# Patient Record
Sex: Female | Born: 1970 | Race: White | Hispanic: No | Marital: Married | Smoking: Never smoker
Health system: Southern US, Community
[De-identification: ages and names within clinical notes are randomized; demographics above are authoritative.]

## PROBLEM LIST (undated history)

## (undated) DIAGNOSIS — T7840XA Allergy, unspecified, initial encounter: Secondary | ICD-10-CM

## (undated) DIAGNOSIS — M1990 Unspecified osteoarthritis, unspecified site: Secondary | ICD-10-CM

## (undated) DIAGNOSIS — F419 Anxiety disorder, unspecified: Secondary | ICD-10-CM

## (undated) DIAGNOSIS — A64 Unspecified sexually transmitted disease: Secondary | ICD-10-CM

## (undated) DIAGNOSIS — E7800 Pure hypercholesterolemia, unspecified: Secondary | ICD-10-CM

## (undated) DIAGNOSIS — M722 Plantar fascial fibromatosis: Secondary | ICD-10-CM

## (undated) DIAGNOSIS — H8109 Meniere's disease, unspecified ear: Secondary | ICD-10-CM

## (undated) HISTORY — DX: Plantar fascial fibromatosis: M72.2

## (undated) HISTORY — PX: WISDOM TOOTH EXTRACTION: SHX21

## (undated) HISTORY — DX: Anxiety disorder, unspecified: F41.9

## (undated) HISTORY — DX: Unspecified osteoarthritis, unspecified site: M19.90

## (undated) HISTORY — DX: Unspecified sexually transmitted disease: A64

## (undated) HISTORY — DX: Pure hypercholesterolemia, unspecified: E78.00

## (undated) HISTORY — DX: Allergy, unspecified, initial encounter: T78.40XA

## (undated) HISTORY — PX: TONSILLECTOMY AND ADENOIDECTOMY: SUR1326

---

## 2003-05-21 DIAGNOSIS — A64 Unspecified sexually transmitted disease: Secondary | ICD-10-CM

## 2003-05-21 HISTORY — DX: Unspecified sexually transmitted disease: A64

## 2007-03-04 ENCOUNTER — Other Ambulatory Visit: Admission: RE | Admit: 2007-03-04 | Discharge: 2007-03-04 | Payer: Self-pay | Admitting: Family Medicine

## 2007-12-10 ENCOUNTER — Emergency Department (HOSPITAL_COMMUNITY): Admission: EM | Admit: 2007-12-10 | Discharge: 2007-12-11 | Payer: Self-pay | Admitting: Emergency Medicine

## 2007-12-18 ENCOUNTER — Encounter: Admission: RE | Admit: 2007-12-18 | Discharge: 2007-12-18 | Payer: Self-pay | Admitting: Family Medicine

## 2010-05-31 ENCOUNTER — Other Ambulatory Visit
Admission: RE | Admit: 2010-05-31 | Discharge: 2010-05-31 | Payer: Self-pay | Source: Home / Self Care | Admitting: Family Medicine

## 2011-02-15 LAB — URINALYSIS, ROUTINE W REFLEX MICROSCOPIC
Glucose, UA: NEGATIVE
Ketones, ur: >80 — AB
Specific Gravity, Urine: 1.028
pH: 6.5

## 2011-02-15 LAB — CBC
HCT: 37.9
Hemoglobin: 12.9
RBC: 4.35
RDW: 12.5
WBC: 10.2

## 2011-02-15 LAB — POCT I-STAT, CHEM 8
BUN: 14
Calcium, Ion: 1.1 — ABNORMAL LOW
Chloride: 104
Creatinine, Ser: 1.1
Glucose, Bld: 86
Potassium: 3.9

## 2011-02-15 LAB — DIFFERENTIAL
Eosinophils Relative: 1
Lymphocytes Relative: 24
Lymphs Abs: 2.4
Monocytes Absolute: 0.7
Monocytes Relative: 7
Neutro Abs: 6.9

## 2011-02-15 LAB — B. BURGDORFI ANTIBODIES: B burgdorferi Ab IgG+IgM: 0.35

## 2011-09-18 ENCOUNTER — Ambulatory Visit: Payer: Self-pay | Admitting: Sports Medicine

## 2011-11-08 ENCOUNTER — Other Ambulatory Visit: Payer: Self-pay | Admitting: Family Medicine

## 2011-11-08 DIAGNOSIS — L989 Disorder of the skin and subcutaneous tissue, unspecified: Secondary | ICD-10-CM

## 2011-11-11 ENCOUNTER — Ambulatory Visit
Admission: RE | Admit: 2011-11-11 | Discharge: 2011-11-11 | Disposition: A | Discharge status: Home / Self Care | Payer: BC Managed Care – PPO | Source: Ambulatory Visit | Attending: Family Medicine | Admitting: Family Medicine

## 2011-11-11 ENCOUNTER — Other Ambulatory Visit: Payer: Self-pay | Admitting: Family Medicine

## 2011-11-11 DIAGNOSIS — L989 Disorder of the skin and subcutaneous tissue, unspecified: Secondary | ICD-10-CM

## 2012-02-06 ENCOUNTER — Encounter: Payer: Self-pay | Admitting: Obstetrics and Gynecology

## 2012-02-06 ENCOUNTER — Ambulatory Visit (INDEPENDENT_AMBULATORY_CARE_PROVIDER_SITE_OTHER): Payer: BC Managed Care – PPO | Admitting: Obstetrics and Gynecology

## 2012-02-06 VITALS — BP 90/60 | Resp 16 | Ht 66.0 in | Wt 128.0 lb

## 2012-02-06 DIAGNOSIS — N920 Excessive and frequent menstruation with regular cycle: Secondary | ICD-10-CM

## 2012-02-06 DIAGNOSIS — F419 Anxiety disorder, unspecified: Secondary | ICD-10-CM

## 2012-02-06 DIAGNOSIS — F411 Generalized anxiety disorder: Secondary | ICD-10-CM

## 2012-02-06 DIAGNOSIS — Z888 Allergy status to other drugs, medicaments and biological substances status: Secondary | ICD-10-CM

## 2012-02-06 DIAGNOSIS — N946 Dysmenorrhea, unspecified: Secondary | ICD-10-CM

## 2012-02-06 DIAGNOSIS — Z124 Encounter for screening for malignant neoplasm of cervix: Secondary | ICD-10-CM

## 2012-02-06 DIAGNOSIS — N979 Female infertility, unspecified: Secondary | ICD-10-CM

## 2012-02-06 DIAGNOSIS — Z889 Allergy status to unspecified drugs, medicaments and biological substances status: Secondary | ICD-10-CM

## 2012-02-06 DIAGNOSIS — H8109 Meniere's disease, unspecified ear: Secondary | ICD-10-CM

## 2012-02-06 LAB — POCT URINE PREGNANCY: Preg Test, Ur: NEGATIVE

## 2012-02-06 MED ORDER — TRANEXAMIC ACID 650 MG PO TABS: 1300.0000 mg | ORAL_TABLET | Freq: Three times a day (TID) | ORAL | Status: DC

## 2012-02-06 NOTE — Progress Notes (Signed)
Regular Periods: no Mammogram: yes 2011 wnl  Monthly Breast Ex.: yes Exercise: yes  Tetanus < 10 years: yes Seatbelts: yes  NI. Bladder Functn.: yes Abuse at home: no  Daily BM's: yes Stressful Work: NO  Healthy Diet: yes Sigmoid-Colonoscopy: NEVER  Calcium: yes Medical problems this year: c/o irregular menses, heavy menses, intolerable cramping   LAST PAP: Jan 2012 PCP informed her pap due until 2015  Contraception: none Trying conceive  Mammogram:  Yes 2011 wnl  PCP: Sigmund Hazel  PMH: see hx  FMH: see hx  Last Bone Scan: never   *PT STATES SHE IS CONCERNED BECAUSE SHE KNOWS IBUPROFEN IS BAD FOR YOUR STOMACH & SHE NEEDS IT EVERYDAY FOR PAIN.

## 2012-02-06 NOTE — Progress Notes (Signed)
Subjective:    Tamara Combs is a 41 y.o. female, No obstetric history on file., who presents for an annual exam as a new patient.  Her primary concerns are her desire to conceive and dysmenorrhea/menorrhagia.   Patient is employed as Museum/gallery curator at [employer].  Very physically active--running, biking, dancing.  Weight is stable.  No eating disorders noted.  She notes some struggles with anxiety.  Also has Meniere's disease.  Patient reports:  Stopped OCPs 1/12.  Previous Ortho-Tricyclen user since age 30 for dysmenorrhea and menorrhagia.  After stopping OCPs, cycles have been monthly, sometimes q 3 weeks, with resumption of dysmenorrhea.  Cycles heavy for 1st 1-2 days, then last additional 4 days with less flow.  She reports possible ovulatory signs, but has not done any ovulation testing.  No IM bleeding.  Using high-dose Motrin for pain (1000 mg per dose!).  Occasional dyspareunia with deep penetration.  She and female partner of 10 years desire conception--no previous w/u.  Partner, Harvie Heck, has no other children and no known medical issues.  Patient Active Problem List  Diagnosis  . Infertility, female  . Dysmenorrhea  . Menorrhagia  . Anxiety  . Multiple drug allergies  . Meniere's disease       History   Social History  . Marital Status: Single    Spouse Name: N/A    Number of Children: N/A  . Years of Education: N/A   Social History Main Topics  . Smoking status: Never Smoker   . Smokeless tobacco: Never Used  . Alcohol Use: 0.5 oz/week    1 drink(s) per week  . Drug Use: No  . Sexually Active: Yes -- Female partner(s)    Birth Control/ Protection: None   Other Topics Concern  . None   Social History Narrative  . None    Menstrual cycle:   LMP: Patient's last menstrual period was 02/04/2012.           Cycle: q 3-4 weeks, with severe dysmenorrhea, heavy 1st day, last 4-7 days/  The following portions of the patient's history were reviewed  and updated as appropriate: allergies, current medications, past family history, past medical history, past social history, past surgical history and problem list.  Review of Systems Pertinent items are noted in HPI. Breast:Negative for breast lump,nipple discharge or nipple retraction Gastrointestinal: Negative for abdominal pain, change in bowel habits or rectal bleeding Urinary:negative   Objective:    BP 90/60  Resp 16  Ht 5\' 6"  (1.676 m)  Wt 128 lb (58.06 kg)  BMI 20.66 kg/m2  LMP 02/04/2012    Weight:  Wt Readings from Last 1 Encounters:  02/06/12 128 lb (58.06 kg)          BMI: Body mass index is 20.66 kg/(m^2).  General Appearance: Alert, appropriate appearance for age. No acute distress HEENT: Grossly normal Neck / Thyroid: Supple, no masses, nodes or enlargement Lungs: clear to auscultation bilaterally Back: No CVA tenderness Breast Exam: No masses or nodes.No dimpling, nipple retraction or discharge. Cardiovascular: Regular rate and rhythm. S1, S2, no murmur Gastrointestinal: Soft, non-tender, no masses or organomegaly Pelvic Exam: Vulva and vagina appear normal. Bimanual exam reveals normal uterus and adnexa. Rectovaginal: normal rectal, no masses Lymphatic Exam: Non-palpable nodes in neck, clavicular, axillary, or inguinal regions Skin: no rash or abnormalities Neurologic: Normal gait and speech, no tremor  Psychiatric: Alert and oriented, appropriate affect.   Wet Prep:not applicable Urinalysis:not applicable UPT: Negative, Not done  Assessment:    Dysmenorrhea/menorrhagia  Desires conception and further evaluation of fertility issues.   Plan:    Mammogram: Last one 2011 Pap:  Done today STD screening: declined Contraception:no method > 1 year Plan day 21 progesterone, TSH, prolactin on 10/9. Plan semen analysis for partner Refer to Dr. Estanislado Pandy for fertility w/u.  Reviewed possible issues of impact of age, anovulatory cycles, endometriosis, female  factor on fertility. Rx Lysteda for trial of effectiveness prior to conception.      Nyra Capes, [place]

## 2012-02-07 LAB — PAP IG W/ RFLX HPV ASCU

## 2012-02-26 ENCOUNTER — Other Ambulatory Visit: Payer: BC Managed Care – PPO

## 2012-02-26 DIAGNOSIS — N946 Dysmenorrhea, unspecified: Secondary | ICD-10-CM

## 2012-02-26 DIAGNOSIS — N979 Female infertility, unspecified: Secondary | ICD-10-CM

## 2012-02-26 LAB — PROLACTIN: Prolactin: 14.5 ng/mL

## 2012-02-27 ENCOUNTER — Telehealth: Payer: Self-pay | Admitting: Obstetrics and Gynecology

## 2012-02-27 NOTE — Telephone Encounter (Signed)
TC to patient regarding test results:  Prolactin and TSH WNL, day 21 progesterone .7 (low). Have sent results to Dr. Estanislado Pandy. Patient has appt with SR 03/11/12. Will f/u on semen analysis--done by patient's husband already.

## 2012-03-05 ENCOUNTER — Encounter: Payer: BC Managed Care – PPO | Admitting: Obstetrics and Gynecology

## 2012-03-11 ENCOUNTER — Encounter: Payer: Self-pay | Admitting: Obstetrics and Gynecology

## 2012-03-11 ENCOUNTER — Ambulatory Visit (INDEPENDENT_AMBULATORY_CARE_PROVIDER_SITE_OTHER): Payer: BC Managed Care – PPO | Admitting: Obstetrics and Gynecology

## 2012-03-11 VITALS — BP 100/62 | Wt 130.0 lb

## 2012-03-11 DIAGNOSIS — E7800 Pure hypercholesterolemia, unspecified: Secondary | ICD-10-CM | POA: Insufficient documentation

## 2012-03-11 DIAGNOSIS — N979 Female infertility, unspecified: Secondary | ICD-10-CM

## 2012-03-11 NOTE — Progress Notes (Signed)
Subjective:    Tamara Combs is a 41 y.o. female No obstetric history on file. who presents for evaluation of infertility. Patient and partner have been attempting conception since 2012 Partner: Boyfriend is 30 years old does not have children. Pregnancies with current partner: no.  Menstrual history:   LMP:  Patient's last menstrual period was 02/24/2012. Menstrual cycle: Stopped OCPs 1/12. Previous Ortho-Tricyclen user since age 42 for dysmenorrhea and menorrhagia. After stopping OCPs, cycles have been monthly, sometimes q 3 weeks, with resumption of dysmenorrhea. Cycles heavy for 1st 1-2 days, then last additional 4 days with less flow. She reports possible ovulatory signs, but has not done any ovulation testing. No IM bleeding. Using high-dose Motrin for pain (1000 mg per dose!).   Endocrine history:   Basal body temperature   TSH Normal 01/2012  Prolactin Normal 01/2012  FSH   Day 21 progesterone Low 01/2012  Hysterosalpyngogram   Glucose:Insulin ratio   Semen analysis Essentially normal. Borderline forward progression  Medications Valium: cat D Maxzide: cat C Meclizine: cat B  Other therapies   Insemination    Obstetrical history:   Never pregnant  Contraception history:   None for more than 1 year. No previous IUD. No STD.   Habits:   Patient reports:  History   Social History  . Marital Status: Single    Spouse Name: N/A    Number of Children: N/A  . Years of Education: N/A   Social History Main Topics  . Smoking status: Never Smoker   . Smokeless tobacco: Never Used  . Alcohol Use: 0.5 oz/week    1 drink(s) per week  . Drug Use: No  . Sexually Active: Yes -- Female partner(s)    Birth Control/ Protection: None   Other Topics Concern  . None   Social History Narrative  . None   Partner reports: alcohol intake:social drinker  The following portions of the patient's history were reviewed and updated as appropriate: allergies, current medications, past  family history, past medical history, past social history, past surgical history and problem list.  Review of Systems Pertinent items are noted in HPI.   Objective:     BP 100/62  Wt 130 lb (58.968 kg)  LMP 02/24/2012   Wt Readings from Last 1 Encounters:  03/11/12 130 lb (58.968 kg)    BMI: There is no height on file to calculate BMI.  General Appearance: Alert, appropriate appearance for age. No acute distress Normal exam 01/2012 with VL for AEX with Pap  Assessment:   Primary infertility,  Suspected / known ovulation factor.  AMA Meniere's disease using Valium PRN  Plan:   Tests ordered:   [facility]  Treatment:  Suggest clomid. Clomid reviewed: 1. How to use    2. Possible side effects: moodiness, mild cramping and headache    3. Need to limit the use to < 6 months due to endometrial thinning and cervical mucous thickening    4. Need to perform a pelvic exam prior to initiating and/or increasing dosage to rule out existing ovarian cyst    5. 10 % twin pregnancy    6. Importance of timed intercourse to be successful Follow-up with Day 1-2-3 exam         Silverio Lay MD

## 2012-03-17 ENCOUNTER — Telehealth: Payer: Self-pay | Admitting: Obstetrics and Gynecology

## 2012-03-17 NOTE — Telephone Encounter (Signed)
TC from pt.  Informed per DR SR AMH normal. Pt states is having cramping ans spotting.  Sched for DAy 1,2,3 exam 03/18/12 with Dr SR.

## 2012-03-17 NOTE — Telephone Encounter (Signed)
Returned pt's call. LM to return call.   

## 2012-03-18 ENCOUNTER — Ambulatory Visit (INDEPENDENT_AMBULATORY_CARE_PROVIDER_SITE_OTHER): Payer: BC Managed Care – PPO | Admitting: Obstetrics and Gynecology

## 2012-03-18 ENCOUNTER — Encounter: Payer: Self-pay | Admitting: Obstetrics and Gynecology

## 2012-03-18 VITALS — BP 100/60 | Ht 64.0 in | Wt 130.0 lb

## 2012-03-18 DIAGNOSIS — N970 Female infertility associated with anovulation: Secondary | ICD-10-CM

## 2012-03-18 DIAGNOSIS — N979 Female infertility, unspecified: Secondary | ICD-10-CM

## 2012-03-18 MED ORDER — CLOMIPHENE CITRATE 50 MG PO TABS: 50.0000 mg | ORAL_TABLET | Freq: Every day | ORAL | Status: DC

## 2012-03-18 NOTE — Progress Notes (Signed)
Subjective:    Tamara Combs is a 41 y.o. female, G0P0, who presents for ovary check to initiate Clomid.  Day 21 Progesterone on 02/26/2012 was: 0.7  The following portions of the patient's history were reviewed and updated as appropriate: allergies, current medications, past family history.    Objective:    BP 100/60  Ht 5\' 4"  (1.626 m)  Wt 130 lb (58.968 kg)  BMI 22.31 kg/m2  LMP 02/24/2012    Weight:  Wt Readings from Last 1 Encounters:  03/18/12 130 lb (58.968 kg)          BMI: Body mass index is 22.31 kg/(m^2).  General Appearance: Alert, appropriate appearance for age. No acute distress Pelvic Exam: Adnexa: normal bimanual exam  Assessment:   Infertility / Anovulation   Plan:    Clomid  50 mg Day 3-7 Timed intercourse reviewed Repeat Day 21 progesterone    Silverio Lay MD

## 2012-04-07 ENCOUNTER — Other Ambulatory Visit: Payer: BC Managed Care – PPO

## 2012-04-15 ENCOUNTER — Telehealth: Payer: Self-pay | Admitting: Obstetrics and Gynecology

## 2012-04-15 DIAGNOSIS — N979 Female infertility, unspecified: Secondary | ICD-10-CM

## 2012-04-15 NOTE — Telephone Encounter (Signed)
TC to pt.  States LMP is today.  Needs to schedule 21 da progesterone.  Lab sched 05/05/12

## 2012-05-05 ENCOUNTER — Other Ambulatory Visit: Payer: BC Managed Care – PPO

## 2012-05-05 DIAGNOSIS — N979 Female infertility, unspecified: Secondary | ICD-10-CM

## 2012-05-05 LAB — PROGESTERONE: Progesterone: 21.2 ng/mL

## 2012-05-06 ENCOUNTER — Telehealth: Payer: Self-pay

## 2012-05-06 ENCOUNTER — Other Ambulatory Visit: Payer: Self-pay | Admitting: Obstetrics and Gynecology

## 2012-05-06 ENCOUNTER — Telehealth: Payer: Self-pay | Admitting: Obstetrics and Gynecology

## 2012-05-06 MED ORDER — CLOMIPHENE CITRATE 50 MG PO TABS: 50.0000 mg | ORAL_TABLET | Freq: Every day | ORAL | Status: DC

## 2012-05-06 NOTE — Telephone Encounter (Signed)
Message copied by Darien Ramus on Wed May 06, 2012  6:45 PM ------      Message from: Silverio Lay      Created: Wed May 06, 2012  2:25 PM       Please inform pt is ovulatory on 50 mg Clomid. Continue for 3 months with timed IC. OK to refill prescription for 3 more months. If not pregnant by then, needs follow-up appointment

## 2012-05-06 NOTE — Telephone Encounter (Signed)
Tc to pt regarding msg.  Pt informed progesterone @ 21.2 and is ovulatory of Clomid 50 mg.  Pt informed of msg per SR.  Pt voices understanding and understands if still no pregnancy after 3 rounds of Clomid will need a f/u appt.

## 2012-05-06 NOTE — Progress Notes (Signed)
Quick Note:  Please inform pt is ovulatory on 50 mg Clomid. Continue for 3 months with timed IC. OK to refill prescription for 3 more months. If not pregnant by then, needs follow-up appointment ______

## 2012-05-06 NOTE — Telephone Encounter (Signed)
Advised pt. Voiced understanding  Darien Ramus, CMA

## 2012-07-04 ENCOUNTER — Other Ambulatory Visit: Payer: Self-pay

## 2012-07-16 ENCOUNTER — Telehealth: Payer: Self-pay | Admitting: Obstetrics and Gynecology

## 2012-07-16 DIAGNOSIS — N979 Female infertility, unspecified: Secondary | ICD-10-CM

## 2012-07-16 NOTE — Telephone Encounter (Signed)
Day 21 progesterone order put in system for pt. Advised that since day 21 of cycle falls on Saturday will need to go to [facility] on [address]. Order faxed and phone number given to patient. Advised that if no pregnancy after 3 dosage of clomid which falls next month. Will need f/u apt.   Darien Ramus, CMA

## 2012-07-18 ENCOUNTER — Other Ambulatory Visit: Payer: Self-pay | Admitting: Obstetrics and Gynecology

## 2012-07-18 LAB — PROGESTERONE: Progesterone: 13.5 ng/mL

## 2012-07-20 ENCOUNTER — Telehealth: Payer: Self-pay

## 2012-07-20 MED ORDER — CLOMIPHENE CITRATE 50 MG PO TABS: 50.0000 mg | ORAL_TABLET | Freq: Every day | ORAL | Status: DC

## 2012-07-20 NOTE — Telephone Encounter (Signed)
Message copied by Darien Ramus on Mon Jul 20, 2012  8:37 AM ------      Message from: Silverio Lay      Created: Mon Jul 20, 2012  7:35 AM       Please call pt to let her know that she is ovulatory on Clomid 50 mg      To continue with same dosage for up to 4 months. OK to refill Clomid 50 mg day 3-7  #5 / 3 RF ------

## 2012-07-20 NOTE — Telephone Encounter (Signed)
Pt aware. Refills sent to pharmacy.   Darien Ramus, CMA

## 2012-07-20 NOTE — Progress Notes (Signed)
Quick Note:  Please call pt to let her know that she is ovulatory on Clomid 50 mg To continue with same dosage for up to 4 months. OK to refill Clomid 50 mg day 3-7 #5 / 3 RF ______

## 2012-08-04 ENCOUNTER — Telehealth: Payer: Self-pay | Admitting: Obstetrics and Gynecology

## 2012-08-04 NOTE — Telephone Encounter (Signed)
Try calling pt rgd msg number not in service

## 2012-08-13 ENCOUNTER — Other Ambulatory Visit: Payer: Self-pay | Admitting: Obstetrics and Gynecology

## 2012-08-13 LAB — PROGESTERONE: Progesterone: 13.8 ng/mL

## 2012-08-27 ENCOUNTER — Ambulatory Visit (INDEPENDENT_AMBULATORY_CARE_PROVIDER_SITE_OTHER): Payer: BC Managed Care – PPO | Admitting: Family Medicine

## 2012-08-27 VITALS — BP 107/69 | HR 64 | Temp 98.3°F | Resp 16 | Ht 64.75 in | Wt 126.4 lb

## 2012-08-27 DIAGNOSIS — R109 Unspecified abdominal pain: Secondary | ICD-10-CM

## 2012-08-27 DIAGNOSIS — R112 Nausea with vomiting, unspecified: Secondary | ICD-10-CM

## 2012-08-27 DIAGNOSIS — R197 Diarrhea, unspecified: Secondary | ICD-10-CM

## 2012-08-27 DIAGNOSIS — K5289 Other specified noninfective gastroenteritis and colitis: Secondary | ICD-10-CM

## 2012-08-27 DIAGNOSIS — R509 Fever, unspecified: Secondary | ICD-10-CM

## 2012-08-27 LAB — POCT URINE PREGNANCY: Preg Test, Ur: NEGATIVE

## 2012-08-27 LAB — POCT CBC
Granulocyte percent: 55.7 %G (ref 37–80)
Hemoglobin: 12.8 g/dL (ref 12.2–16.2)
MCH, POC: 28.3 pg (ref 27–31.2)
MPV: 8.1 fL (ref 0–99.8)
POC MID %: 10.5 %M (ref 0–12)
Platelet Count, POC: 276 10*3/uL (ref 142–424)
RBC: 4.53 M/uL (ref 4.04–5.48)
WBC: 4.4 10*3/uL — AB (ref 4.6–10.2)

## 2012-08-27 LAB — POCT URINALYSIS DIPSTICK
Glucose, UA: NEGATIVE
Leukocytes, UA: NEGATIVE
Nitrite, UA: NEGATIVE
Spec Grav, UA: 1.025
Urobilinogen, UA: 2

## 2012-08-27 MED ORDER — ONDANSETRON 4 MG PO TBDP: 4.0000 mg | ORAL_TABLET | Freq: Three times a day (TID) | ORAL | Status: DC | PRN

## 2012-08-27 NOTE — Patient Instructions (Signed)
Ok to continue [employer], Immodium if needed.  Zofran if needed for nausea. If diarrhea not improving into tomorrow - can collect stool culture and call to advise of this.  I can call in an antibiotic if not improving into Saturday morning.  .Return to the clinic or go to the nearest emergency room if any of your symptoms worsen or new symptoms occur. Gastroenteritis:  Diarrhea Infections caused by germs (bacterial) or a virus commonly cause diarrhea. Your caregiver has determined that with time, rest and fluids, the diarrhea should improve. In general, eat normally while drinking more water than usual. Although water may prevent dehydration, it does not contain salt and minerals (electrolytes). Broths, weak tea without caffeine and oral rehydration solutions (ORS) replace fluids and electrolytes. Small amounts of fluids should be taken frequently. Large amounts at one time may not be tolerated. Plain water may be harmful in infants and the elderly. Oral rehydrating solutions (ORS) are available at pharmacies and grocery stores. ORS replace water and important electrolytes in proper proportions. Sports drinks are not as effective as ORS and may be harmful due to sugars worsening diarrhea.  ORS is especially recommended for use in children with diarrhea. As a general guideline for children, replace any new fluid losses from diarrhea and/or vomiting with ORS as follows:   If your child weighs 22 pounds or under (10 kg or less), give 60-120 mL ( -  cup or 2 - 4 ounces) of ORS for each episode of diarrheal stool or vomiting episode.   If your child weighs more than 22 pounds (more than 10 kgs), give 120-240 mL ( - 1 cup or 4 - 8 ounces) of ORS for each diarrheal stool or episode of vomiting.   While correcting for dehydration, children should eat normally. However, foods high in sugar should be avoided because this may worsen diarrhea. Large amounts of carbonated soft drinks, juice, gelatin desserts  and other highly sugared drinks should be avoided.   After correction of dehydration, other liquids that are appealing to the child may be added. Children should drink small amounts of fluids frequently and fluids should be increased as tolerated. Children should drink enough fluids to keep urine clear or pale yellow.   Adults should eat normally while drinking more fluids than usual. Drink small amounts of fluids frequently and increase as tolerated. Drink enough fluids to keep urine clear or pale yellow. Broths, weak decaffeinated tea, lemon lime soft drinks (allowed to go flat) and ORS replace fluids and electrolytes.   Avoid:   Carbonated drinks.   Juice.   Extremely hot or cold fluids.   Caffeine drinks.   Fatty, greasy foods.   Alcohol.   Tobacco.   Too much intake of anything at one time.   Gelatin desserts.   Probiotics are active cultures of beneficial bacteria. They may lessen the amount and number of diarrheal stools in adults. Probiotics can be found in yogurt with active cultures and in supplements.   Wash hands well to avoid spreading bacteria and virus.   Anti-diarrheal medications are not recommended for infants and children.   Only take over-the-counter or prescription medicines for pain, discomfort or fever as directed by your caregiver. Do not give aspirin to children because it may cause Reye's Syndrome.   For adults, ask your caregiver if you should continue all prescribed and over-the-counter medicines.   If your caregiver has given you a follow-up appointment, it is very important to keep that appointment. Not  keeping the appointment could result in a chronic or permanent injury, and disability. If there is any problem keeping the appointment, you must call back to this facility for assistance.  SEEK IMMEDIATE MEDICAL CARE IF:   You or your child is unable to keep fluids down or other symptoms or problems become worse in spite of treatment.   Vomiting  or diarrhea develops and becomes persistent.   There is vomiting of blood or bile (green material).   There is blood in the stool or the stools are black and tarry.   There is no urine output in 6-8 hours or there is only a small amount of very dark urine.   Abdominal pain develops, increases or localizes.   You have a fever.   Your baby is older than 3 months with a rectal temperature of 102 F (38.9 C) or higher.   Your baby is 44 months old or younger with a rectal temperature of 100.4 F (38 C) or higher.   You or your child develops excessive weakness, dizziness, fainting or extreme thirst.   You or your child develops a rash, stiff neck, severe headache or become irritable or sleepy and difficult to awaken.  MAKE SURE YOU:   Understand these instructions.   Will watch your condition.   Will get help right away if you are not doing well or get worse.  Document Released: 04/26/2002 Document Revised: 04/25/2011 Document Reviewed: 03/13/2009 [facility] Patient Information 2012 [place], [place].  Nausea and Vomiting Nausea is a sick feeling that often comes before throwing up (vomiting). Vomiting is a reflex where stomach contents come out of your mouth. Vomiting can cause severe loss of body fluids (dehydration). Children and elderly adults can become dehydrated quickly, especially if they also have diarrhea. Nausea and vomiting are symptoms of a condition or disease. It is important to find the cause of your symptoms. CAUSES   Direct irritation of the stomach lining. This irritation can result from increased acid production (gastroesophageal reflux disease), infection, food poisoning, taking certain medicines (such as nonsteroidal anti-inflammatory drugs), alcohol use, or tobacco use.   Signals from the brain.These signals could be caused by a headache, heat exposure, an inner ear disturbance, increased pressure in the brain from injury, infection, a tumor, or a concussion, pain,  emotional stimulus, or metabolic problems.   An obstruction in the gastrointestinal tract (bowel obstruction).   Illnesses such as diabetes, hepatitis, gallbladder problems, appendicitis, kidney problems, cancer, sepsis, atypical symptoms of a heart attack, or eating disorders.   Medical treatments such as chemotherapy and radiation.   Receiving medicine that makes you sleep (general anesthetic) during surgery.  DIAGNOSIS Your caregiver may ask for tests to be done if the problems do not improve after a few days. Tests may also be done if symptoms are severe or if the reason for the nausea and vomiting is not clear. Tests may include:  Urine tests.   Blood tests.   Stool tests.   Cultures (to look for evidence of infection).   X-rays or other imaging studies.  Test results can help your caregiver make decisions about treatment or the need for additional tests. TREATMENT You need to stay well hydrated. Drink frequently but in small amounts.You may wish to drink water, sports drinks, clear broth, or eat frozen ice pops or gelatin dessert to help stay hydrated.When you eat, eating slowly may help prevent nausea.There are also some antinausea medicines that may help prevent nausea. HOME CARE INSTRUCTIONS  Take all medicine as directed by your caregiver.   If you do not have an appetite, do not force yourself to eat. However, you must continue to drink fluids.   If you have an appetite, eat a normal diet unless your caregiver tells you differently.   Eat a variety of complex carbohydrates (rice, wheat, potatoes, bread), lean meats, yogurt, fruits, and vegetables.   Avoid high-fat foods because they are more difficult to digest.   Drink enough water and fluids to keep your urine clear or pale yellow.   If you are dehydrated, ask your caregiver for specific rehydration instructions. Signs of dehydration may include:   Severe thirst.   Dry lips and mouth.   Dizziness.   Dark  urine.   Decreasing urine frequency and amount.   Confusion.   Rapid breathing or pulse.  SEEK IMMEDIATE MEDICAL CARE IF:   You have blood or brown flecks (like coffee grounds) in your vomit.   You have black or bloody stools.   You have a severe headache or stiff neck.   You are confused.   You have severe abdominal pain.   You have chest pain or trouble breathing.   You do not urinate at least once every 8 hours.   You develop cold or clammy skin.   You continue to vomit for longer than 24 to 48 hours.   You have a fever.  MAKE SURE YOU:   Understand these instructions.   Will watch your condition.   Will get help right away if you are not doing well or get worse.  Document Released: 05/06/2005 Document Revised: 04/25/2011 Document Reviewed: 10/03/2010 [facility] Patient Information 2012 [place], [place].  Return to the clinic or go to the nearest emergency room if any of your symptoms worsen or new symptoms occur.

## 2012-08-27 NOTE — Progress Notes (Signed)
Subjective:    Patient ID: Tamara Combs, female    DOB: 06-12-1970, 42 y.o.   MRN: [id number]  HPI Tamara Combs is a 42 y.o. female  ?Stomach flu past few days.  Fever and diarhea with sharp stomach pains. Still with sharp stomach pains. Fever resolved today.  On and off prior past few days - Tmax 101.2 few days ago.  99-100 yesterday. Diarrhea - 5-6 episodes today - worse after eating saltines and gatorade. Able to drink fluids. Last uop few hours ago. Urinating normally. Vomited 1st day only - persistent nausea. Sharp shooting pains all over stomach. No blood in diarrhea. Slightly less watery today.  No hx of abdominal surgery.   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 No known sick contacts. Fiance not sick.  Etoh: few glasses per wine per week, no recent increase, no raw foods, no foreign travel.   On clomid - 4th month.  No recent change in dosage.  Took pregnancy test at time of menses on April 1st.  Tx: pepto bismol, tums - no relief. Tylenol for fever.   Review of Systems  Constitutional: Positive for fever and chills.  Gastrointestinal: Positive for nausea, vomiting, abdominal pain and diarrhea. Negative for blood in stool and abdominal distention.  Genitourinary: Negative for dysuria, hematuria, decreased urine volume, difficulty urinating and menstrual problem.  Skin: Negative for rash.       Objective:   Physical Exam  Vitals reviewed. Constitutional: She is oriented to person, place, and time. She appears well-developed and well-nourished. No distress.  HENT:  Head: Normocephalic and atraumatic.  Right Ear: Hearing, tympanic membrane and ear canal normal.  Left Ear: Hearing, tympanic membrane and ear canal normal.  Mouth/Throat: Oropharynx is clear and moist.  Eyes: Conjunctivae and EOM are normal. Pupils are equal, round, and reactive to light.  Cardiovascular: Normal rate, regular rhythm, normal heart sounds and intact distal pulses.   No murmur heard. Pulmonary/Chest:  Effort normal and breath sounds normal. No respiratory distress. She has no wheezes. She has no rhonchi.  Abdominal: Soft. Bowel sounds are increased. There is no hepatosplenomegaly. There is generalized tenderness (generalized, nonfocal. ). There is no rigidity, no rebound, no guarding and no CVA tenderness. No hernia.  Neurological: She is alert and oriented to person, place, and time.  Skin: Skin is warm and dry. No rash noted.  Psychiatric: She has a normal mood and affect. Her behavior is normal.    Results for orders placed in visit on 08/27/12  POCT CBC      Result Value Range   WBC 4.4 (*) 4.6 - 10.2 K/uL   Lymph, poc 1.5  0.6 - 3.4   POC LYMPH PERCENT 33.8  10 - 50 %L   MID (cbc) 0.5  0 - 0.9   POC MID % 10.5  0 - 12 %M   POC Granulocyte 2.5  2 - 6.9   Granulocyte percent 55.7  37 - 80 %G   RBC 4.53  4.04 - 5.48 M/uL   Hemoglobin 12.8  12.2 - 16.2 g/dL   HCT, POC 46.9  62.9 - 47.9 %   MCV 88.6  80 - 97 fL   MCH, POC 28.3  27 - 31.2 pg   MCHC 31.9  31.8 - 35.4 g/dL   RDW, POC 52.8     Platelet Count, POC 276  142 - 424 K/uL   MPV 8.1  0 - 99.8 fL  POCT URINE PREGNANCY      Result Value  Range   Preg Test, Ur Negative    POCT URINALYSIS DIPSTICK      Result Value Range   Color, UA yellow     Clarity, UA clear     Glucose, UA neg     Bilirubin, UA small     Ketones, UA 15     Spec Grav, UA 1.025     Blood, UA neg     pH, UA 5.5     Protein, UA trace     Urobilinogen, UA 2.0     Nitrite, UA neg     Leukocytes, UA Negative         Assessment & Plan:  Tamara Combs is a 42 y.o. female Abdominal  pain, other specified site - Plan: POCT CBC, POCT urine pregnancy, POCT urinalysis dipstick, Ova and parasite screen, Stool culture  Fever, unspecified - Plan: POCT CBC, POCT urine pregnancy, POCT urinalysis dipstick, Ova and parasite screen, Stool culture  Diarrhea - Plan: POCT CBC, POCT urine pregnancy, POCT urinalysis dipstick, Ova and parasite screen, Stool  culture  Other and unspecified noninfectious gastroenteritis and colitis - Plan: ondansetron (ZOFRAN ODT) 4 MG disintegrating tablet  Nausea with vomiting - Plan: ondansetron (ZOFRAN ODT) 4 MG disintegrating tablet  reassuring cbc - suspected viral illness with resolution of fever. Try sx care as above and below, zofran, ort.  Sent home stool cx if still not improving after tomorrow, then would consider cipro. No abx for now. rtc precautions discussed, understanding expressed.   Meds ordered this encounter  Medications  . ondansetron (ZOFRAN ODT) 4 MG disintegrating tablet    Sig: Take 1 tablet (4 mg total) by mouth every 8 (eight) hours as needed for nausea.    Dispense:  10 tablet    Refill:  0     Patient Instructions  Ok to continue Pepto bismol, Immodium if needed.  Zofran if needed for nausea. If diarrhea not improving into tomorrow - can collect stool culture and call to advise of this.  I can call in an antibiotic if not improving into Saturday morning.  .Return to the clinic or go to the nearest emergency room if any of your symptoms worsen or new symptoms occur. Gastroenteritis:  Diarrhea Infections caused by germs (bacterial) or a virus commonly cause diarrhea. Your caregiver has determined that with time, rest and fluids, the diarrhea should improve. In general, eat normally while drinking more water than usual. Although water may prevent dehydration, it does not contain salt and minerals (electrolytes). Broths, weak tea without caffeine and oral rehydration solutions (ORS) replace fluids and electrolytes. Small amounts of fluids should be taken frequently. Large amounts at one time may not be tolerated. Plain water may be harmful in infants and the elderly. Oral rehydrating solutions (ORS) are available at pharmacies and grocery stores. ORS replace water and important electrolytes in proper proportions. Sports drinks are not as effective as ORS and may be harmful due to sugars  worsening diarrhea.  ORS is especially recommended for use in children with diarrhea. As a general guideline for children, replace any new fluid losses from diarrhea and/or vomiting with ORS as follows:   If your child weighs 22 pounds or under (10 kg or less), give 60-120 mL ( -  cup or 2 - 4 ounces) of ORS for each episode of diarrheal stool or vomiting episode.   If your child weighs more than 22 pounds (more than 10 kgs), give 120-240 mL ( - 1 cup or 4 -  8 ounces) of ORS for each diarrheal stool or episode of vomiting.   While correcting for dehydration, children should eat normally. However, foods high in sugar should be avoided because this may worsen diarrhea. Large amounts of carbonated soft drinks, juice, gelatin desserts and other highly sugared drinks should be avoided.   After correction of dehydration, other liquids that are appealing to the child may be added. Children should drink small amounts of fluids frequently and fluids should be increased as tolerated. Children should drink enough fluids to keep urine clear or pale yellow.   Adults should eat normally while drinking more fluids than usual. Drink small amounts of fluids frequently and increase as tolerated. Drink enough fluids to keep urine clear or pale yellow. Broths, weak decaffeinated tea, lemon lime soft drinks (allowed to go flat) and ORS replace fluids and electrolytes.   Avoid:   Carbonated drinks.   Juice.   Extremely hot or cold fluids.   Caffeine drinks.   Fatty, greasy foods.   Alcohol.   Tobacco.   Too much intake of anything at one time.   Gelatin desserts.   Probiotics are active cultures of beneficial bacteria. They may lessen the amount and number of diarrheal stools in adults. Probiotics can be found in yogurt with active cultures and in supplements.   Wash hands well to avoid spreading bacteria and virus.   Anti-diarrheal medications are not recommended for infants and children.    Only take over-the-counter or prescription medicines for pain, discomfort or fever as directed by your caregiver. Do not give aspirin to children because it may cause Reye's Syndrome.   For adults, ask your caregiver if you should continue all prescribed and over-the-counter medicines.   If your caregiver has given you a follow-up appointment, it is very important to keep that appointment. Not keeping the appointment could result in a chronic or permanent injury, and disability. If there is any problem keeping the appointment, you must call back to this facility for assistance.  SEEK IMMEDIATE MEDICAL CARE IF:   You or your child is unable to keep fluids down or other symptoms or problems become worse in spite of treatment.   Vomiting or diarrhea develops and becomes persistent.   There is vomiting of blood or bile (green material).   There is blood in the stool or the stools are black and tarry.   There is no urine output in 6-8 hours or there is only a small amount of very dark urine.   Abdominal pain develops, increases or localizes.   You have a fever.   Your baby is older than 3 months with a rectal temperature of 102 F (38.9 C) or higher.   Your baby is 68 months old or younger with a rectal temperature of 100.4 F (38 C) or higher.   You or your child develops excessive weakness, dizziness, fainting or extreme thirst.   You or your child develops a rash, stiff neck, severe headache or become irritable or sleepy and difficult to awaken.  MAKE SURE YOU:   Understand these instructions.   Will watch your condition.   Will get help right away if you are not doing well or get worse.  Document Released: 04/26/2002 Document Revised: 04/25/2011 Document Reviewed: 03/13/2009 [facility] Patient Information 2012 [place], [place].  Nausea and Vomiting Nausea is a sick feeling that often comes before throwing up (vomiting). Vomiting is a reflex where stomach contents come out of  your mouth. Vomiting can cause severe  loss of body fluids (dehydration). Children and elderly adults can become dehydrated quickly, especially if they also have diarrhea. Nausea and vomiting are symptoms of a condition or disease. It is important to find the cause of your symptoms. CAUSES   Direct irritation of the stomach lining. This irritation can result from increased acid production (gastroesophageal reflux disease), infection, food poisoning, taking certain medicines (such as nonsteroidal anti-inflammatory drugs), alcohol use, or tobacco use.   Signals from the brain.These signals could be caused by a headache, heat exposure, an inner ear disturbance, increased pressure in the brain from injury, infection, a tumor, or a concussion, pain, emotional stimulus, or metabolic problems.   An obstruction in the gastrointestinal tract (bowel obstruction).   Illnesses such as diabetes, hepatitis, gallbladder problems, appendicitis, kidney problems, cancer, sepsis, atypical symptoms of a heart attack, or eating disorders.   Medical treatments such as chemotherapy and radiation.   Receiving medicine that makes you sleep (general anesthetic) during surgery.  DIAGNOSIS Your caregiver may ask for tests to be done if the problems do not improve after a few days. Tests may also be done if symptoms are severe or if the reason for the nausea and vomiting is not clear. Tests may include:  Urine tests.   Blood tests.   Stool tests.   Cultures (to look for evidence of infection).   X-rays or other imaging studies.  Test results can help your caregiver make decisions about treatment or the need for additional tests. TREATMENT You need to stay well hydrated. Drink frequently but in small amounts.You may wish to drink water, sports drinks, clear broth, or eat frozen ice pops or gelatin dessert to help stay hydrated.When you eat, eating slowly may help prevent nausea.There are also some antinausea  medicines that may help prevent nausea. HOME CARE INSTRUCTIONS   Take all medicine as directed by your caregiver.   If you do not have an appetite, do not force yourself to eat. However, you must continue to drink fluids.   If you have an appetite, eat a normal diet unless your caregiver tells you differently.   Eat a variety of complex carbohydrates (rice, wheat, potatoes, bread), lean meats, yogurt, fruits, and vegetables.   Avoid high-fat foods because they are more difficult to digest.   Drink enough water and fluids to keep your urine clear or pale yellow.   If you are dehydrated, ask your caregiver for specific rehydration instructions. Signs of dehydration may include:   Severe thirst.   Dry lips and mouth.   Dizziness.   Dark urine.   Decreasing urine frequency and amount.   Confusion.   Rapid breathing or pulse.  SEEK IMMEDIATE MEDICAL CARE IF:   You have blood or brown flecks (like coffee grounds) in your vomit.   You have black or bloody stools.   You have a severe headache or stiff neck.   You are confused.   You have severe abdominal pain.   You have chest pain or trouble breathing.   You do not urinate at least once every 8 hours.   You develop cold or clammy skin.   You continue to vomit for longer than 24 to 48 hours.   You have a fever.  MAKE SURE YOU:   Understand these instructions.   Will watch your condition.   Will get help right away if you are not doing well or get worse.  Document Released: 05/06/2005 Document Revised: 04/25/2011 Document Reviewed: 10/03/2010 [employer] Patient Information 2012  [employer].  Return to the clinic or go to the nearest emergency room if any of your symptoms worsen or new symptoms occur.

## 2012-09-02 LAB — STOOL CULTURE

## 2012-09-02 LAB — OVA AND PARASITE SCREEN: OP: NONE SEEN

## 2012-09-04 ENCOUNTER — Telehealth: Payer: Self-pay

## 2012-09-04 NOTE — Telephone Encounter (Signed)
  Call pt - stool cultures did not show any concerning bacteria, ova, or parasites. Likely viral cause of her symptoms. recheck if not improving, sooner if worse.      Patient advised, she is much better.

## 2012-09-04 NOTE — Telephone Encounter (Signed)
Pt is calling back about some lab results.

## 2012-12-18 LAB — OB RESULTS CONSOLE RUBELLA ANTIBODY, IGM: RUBELLA: IMMUNE

## 2012-12-18 LAB — OB RESULTS CONSOLE ABO/RH: RH Type: POSITIVE

## 2012-12-18 LAB — OB RESULTS CONSOLE RPR: RPR: NONREACTIVE

## 2012-12-18 LAB — OB RESULTS CONSOLE HIV ANTIBODY (ROUTINE TESTING): HIV: NONREACTIVE

## 2012-12-18 LAB — OB RESULTS CONSOLE HEPATITIS B SURFACE ANTIGEN: HEP B S AG: NEGATIVE

## 2012-12-18 LAB — OB RESULTS CONSOLE GBS: GBS: NEGATIVE

## 2012-12-18 LAB — OB RESULTS CONSOLE GC/CHLAMYDIA
CHLAMYDIA, DNA PROBE: NEGATIVE
Gonorrhea: NEGATIVE

## 2012-12-18 LAB — OB RESULTS CONSOLE ANTIBODY SCREEN: ANTIBODY SCREEN: NEGATIVE

## 2013-03-25 ENCOUNTER — Other Ambulatory Visit: Payer: Self-pay

## 2013-05-20 NOTE — L&D Delivery Note (Signed)
Delivery Note At 5:02 AM a viable female, "Lucretia Roers" was delivered via Vaginal, Spontaneous Delivery in waterbirth tub (Presentation: ROA;  ).  APGAR: 9, 9; weight 8 lb 12.8 oz (3992 g).   Placenta status: Intact, Spontaneous.  Cord: 3 vessels with the following complications: None.  Body cord noted.  Cord pH: NA  Anesthesia: None  Episiotomy: Local Lacerations: 2nd degree Suture Repair: 3.0 Est. Blood Loss (mL): 300  Mom to postpartum.  Baby to Couplet care / Skin to Skin. Inpatient circumcision planned.  Donnel Saxon 08/04/2013, 7:13 AM

## 2013-07-19 ENCOUNTER — Encounter: Payer: Self-pay | Admitting: *Deleted

## 2013-08-04 ENCOUNTER — Encounter (HOSPITAL_COMMUNITY): Payer: Self-pay | Admitting: *Deleted

## 2013-08-04 ENCOUNTER — Inpatient Hospital Stay (HOSPITAL_COMMUNITY)
Admission: AD | Admit: 2013-08-04 | Discharge: 2013-08-06 | DRG: 775 | Disposition: A | Discharge status: Home / Self Care | Payer: BC Managed Care – PPO | Source: Ambulatory Visit | Attending: Obstetrics and Gynecology | Admitting: Obstetrics and Gynecology

## 2013-08-04 DIAGNOSIS — IMO0001 Reserved for inherently not codable concepts without codable children: Secondary | ICD-10-CM

## 2013-08-04 DIAGNOSIS — Z22330 Carrier of Group B streptococcus: Secondary | ICD-10-CM

## 2013-08-04 DIAGNOSIS — O99892 Other specified diseases and conditions complicating childbirth: Secondary | ICD-10-CM | POA: Diagnosis present

## 2013-08-04 DIAGNOSIS — B951 Streptococcus, group B, as the cause of diseases classified elsewhere: Secondary | ICD-10-CM | POA: Diagnosis present

## 2013-08-04 DIAGNOSIS — O9989 Other specified diseases and conditions complicating pregnancy, childbirth and the puerperium: Secondary | ICD-10-CM

## 2013-08-04 DIAGNOSIS — H8109 Meniere's disease, unspecified ear: Secondary | ICD-10-CM | POA: Diagnosis present

## 2013-08-04 HISTORY — DX: Meniere's disease, unspecified ear: H81.09

## 2013-08-04 LAB — CBC
HCT: 36.2 % (ref 36.0–46.0)
Hemoglobin: 13.2 g/dL (ref 12.0–15.0)
MCH: 31.7 pg (ref 26.0–34.0)
MCHC: 36.5 g/dL — AB (ref 30.0–36.0)
MCV: 87 fL (ref 78.0–100.0)
PLATELETS: 222 10*3/uL (ref 150–400)
RBC: 4.16 MIL/uL (ref 3.87–5.11)
RDW: 13.3 % (ref 11.5–15.5)
WBC: 16.1 10*3/uL — ABNORMAL HIGH (ref 4.0–10.5)

## 2013-08-04 LAB — RPR: RPR Ser Ql: NONREACTIVE

## 2013-08-04 LAB — OB RESULTS CONSOLE GBS: GBS: POSITIVE

## 2013-08-04 MED ORDER — ONDANSETRON HCL 4 MG/2ML IJ SOLN: 4.0000 mg | Freq: Four times a day (QID) | INTRAMUSCULAR | Status: DC | PRN

## 2013-08-04 MED ORDER — SODIUM CHLORIDE 0.9 % IJ SOLN: 3.0000 mL | Freq: Two times a day (BID) | INTRAMUSCULAR | Status: DC

## 2013-08-04 MED ORDER — OXYTOCIN BOLUS FROM INFUSION
500.0000 mL | INTRAVENOUS | Status: DC
  Administered 2013-08-04: 500 mL via INTRAVENOUS

## 2013-08-04 MED ORDER — PRENATAL MULTIVITAMIN CH
1.0000 | ORAL_TABLET | Freq: Every day | ORAL | Status: DC
  Administered 2013-08-04 – 2013-08-05 (×2): 1 via ORAL
  Filled 2013-08-04 (×2): qty 1

## 2013-08-04 MED ORDER — FLEET ENEMA 7-19 GM/118ML RE ENEM: 1.0000 | ENEMA | RECTAL | Status: DC | PRN

## 2013-08-04 MED ORDER — LANOLIN HYDROUS EX OINT: TOPICAL_OINTMENT | CUTANEOUS | Status: DC | PRN

## 2013-08-04 MED ORDER — IBUPROFEN 600 MG PO TABS
600.0000 mg | ORAL_TABLET | Freq: Four times a day (QID) | ORAL | Status: DC
  Administered 2013-08-04 – 2013-08-06 (×8): 600 mg via ORAL
  Filled 2013-08-04 (×8): qty 1

## 2013-08-04 MED ORDER — SODIUM CHLORIDE 0.9 % IV SOLN: 250.0000 mL | INTRAVENOUS | Status: DC | PRN

## 2013-08-04 MED ORDER — ONDANSETRON HCL 4 MG PO TABS
4.0000 mg | ORAL_TABLET | ORAL | Status: DC | PRN
Start: 2013-08-04 — End: 2013-08-06

## 2013-08-04 MED ORDER — DIPHENHYDRAMINE HCL 25 MG PO CAPS: 25.0000 mg | ORAL_CAPSULE | Freq: Four times a day (QID) | ORAL | Status: DC | PRN

## 2013-08-04 MED ORDER — TETANUS-DIPHTH-ACELL PERTUSSIS 5-2.5-18.5 LF-MCG/0.5 IM SUSP: 0.5000 mL | Freq: Once | INTRAMUSCULAR | Status: DC

## 2013-08-04 MED ORDER — ACETAMINOPHEN 325 MG PO TABS
650.0000 mg | ORAL_TABLET | Freq: Four times a day (QID) | ORAL | Status: DC | PRN
  Administered 2013-08-04 (×2): 650 mg via ORAL
  Filled 2013-08-04 (×2): qty 2

## 2013-08-04 MED ORDER — LIDOCAINE HCL (PF) 1 % IJ SOLN
30.0000 mL | INTRAMUSCULAR | Status: AC | PRN
  Administered 2013-08-04: 30 mL via SUBCUTANEOUS
  Filled 2013-08-04: qty 30

## 2013-08-04 MED ORDER — SENNOSIDES-DOCUSATE SODIUM 8.6-50 MG PO TABS
2.0000 | ORAL_TABLET | ORAL | Status: DC
  Administered 2013-08-04 – 2013-08-05 (×2): 2 via ORAL
  Filled 2013-08-04 (×2): qty 2

## 2013-08-04 MED ORDER — SIMETHICONE 80 MG PO CHEW: 80.0000 mg | CHEWABLE_TABLET | ORAL | Status: DC | PRN

## 2013-08-04 MED ORDER — CEFAZOLIN SODIUM 1-5 GM-% IV SOLN
1.0000 g | Freq: Three times a day (TID) | INTRAVENOUS | Status: DC
  Filled 2013-08-04: qty 50

## 2013-08-04 MED ORDER — MECLIZINE HCL 25 MG PO TABS
12.5000 mg | ORAL_TABLET | Freq: Four times a day (QID) | ORAL | Status: DC | PRN
  Administered 2013-08-04: 12.5 mg via ORAL
  Filled 2013-08-04: qty 1

## 2013-08-04 MED ORDER — CEFAZOLIN SODIUM-DEXTROSE 2-3 GM-% IV SOLR
2.0000 g | Freq: Once | INTRAVENOUS | Status: AC
  Administered 2013-08-04: 2 g via INTRAVENOUS
  Filled 2013-08-04: qty 50

## 2013-08-04 MED ORDER — CITRIC ACID-SODIUM CITRATE 334-500 MG/5ML PO SOLN: 30.0000 mL | ORAL | Status: DC | PRN

## 2013-08-04 MED ORDER — DIBUCAINE 1 % RE OINT
1.0000 "application " | TOPICAL_OINTMENT | RECTAL | Status: DC | PRN
  Administered 2013-08-04: 1 via RECTAL
  Filled 2013-08-04: qty 28

## 2013-08-04 MED ORDER — ZOLPIDEM TARTRATE 5 MG PO TABS: 5.0000 mg | ORAL_TABLET | Freq: Every evening | ORAL | Status: DC | PRN

## 2013-08-04 MED ORDER — BENZOCAINE-MENTHOL 20-0.5 % EX AERO
1.0000 "application " | INHALATION_SPRAY | CUTANEOUS | Status: DC | PRN
  Administered 2013-08-04: 1 via TOPICAL
  Filled 2013-08-04: qty 56

## 2013-08-04 MED ORDER — OXYTOCIN 40 UNITS IN LACTATED RINGERS INFUSION - SIMPLE MED
62.5000 mL/h | INTRAVENOUS | Status: DC
  Filled 2013-08-04: qty 1000

## 2013-08-04 MED ORDER — ONDANSETRON HCL 4 MG/2ML IJ SOLN: 4.0000 mg | INTRAMUSCULAR | Status: DC | PRN

## 2013-08-04 MED ORDER — WITCH HAZEL-GLYCERIN EX PADS
1.0000 "application " | MEDICATED_PAD | CUTANEOUS | Status: DC | PRN
  Administered 2013-08-04: 1 via TOPICAL

## 2013-08-04 MED ORDER — LACTATED RINGERS IV SOLN: INTRAVENOUS | Status: DC

## 2013-08-04 MED ORDER — ACETAMINOPHEN 325 MG PO TABS: 650.0000 mg | ORAL_TABLET | ORAL | Status: DC | PRN

## 2013-08-04 MED ORDER — IBUPROFEN 600 MG PO TABS
600.0000 mg | ORAL_TABLET | Freq: Four times a day (QID) | ORAL | Status: DC | PRN
  Administered 2013-08-04: 600 mg via ORAL
  Filled 2013-08-04: qty 1

## 2013-08-04 MED ORDER — LACTATED RINGERS IV SOLN: 500.0000 mL | INTRAVENOUS | Status: DC | PRN

## 2013-08-04 MED ORDER — SODIUM CHLORIDE 0.9 % IJ SOLN: 3.0000 mL | INTRAMUSCULAR | Status: DC | PRN

## 2013-08-04 NOTE — MAU Note (Signed)
Contractions  

## 2013-08-04 NOTE — Lactation Note (Signed)
This note was copied from the chart of Boy Meygan Kyser. Lactation Consultation Note  Patient Name: Boy Kailene Steinhart HALPF'X Date: 08/04/2013 Reason for consult: Follow-up assessment;Difficult latch  Baby starting to cue, Mom called for assistance with latching.  Positioned baby in football hold, and teaching done on breast support needed.  Baby tends to suck on tongue, so some suck training done.  Baby opened widely, and with breast sandwiching, baby latched , but quickly slipped down to nipple and fell asleep.  Multiple attempts made.  Initiated a 24 mm nipple shield.  Teaching on use and care.  Nipple pulls well into shield.  Colostrum expressed easily.  Baby opened and latched fairly well, but after a couple minutes of sucking, he fell asleep.  Mom very tired, so after this skin to skin with baby, she plans to try to sleep.  Recommended Dad do skin to skin, or wrap baby in blankets and place in crib.  Mom to call for assistance when baby cues again.    Maternal Data Formula Feeding for Exclusion: No Infant to breast within first hour of birth: Yes Has patient been taught Hand Expression?: Yes Does the patient have breastfeeding experience prior to this delivery?: No  Feeding Feeding Type: Breast Fed Length of feed: 2 min  LATCH Score/Interventions Latch: Repeated attempts needed to sustain latch, nipple held in mouth throughout feeding, stimulation needed to elicit sucking reflex. Intervention(s): Waking techniques;Teach feeding cues;Skin to skin Intervention(s): Adjust position;Assist with latch;Breast massage;Breast compression  Audible Swallowing: None Intervention(s): Hand expression;Skin to skin  Type of Nipple: Flat (short nipple shaft, compressible areola) Intervention(s): No intervention needed  Comfort (Breast/Nipple): Soft / non-tender     Hold (Positioning): Assistance needed to correctly position infant at breast and maintain latch. Intervention(s): Breastfeeding  basics reviewed;Support Pillows;Position options;Skin to skin  LATCH Score: 5  Lactation Tools Discussed/Used Tools: Nipple Shields Nipple shield size: 24   Consult Status Consult Status: Follow-up Date: 08/05/13 Follow-up type: In-patient    Broadus John 08/04/2013, 11:28 AM

## 2013-08-04 NOTE — H&P (Signed)
Tamara Combs is a 43 y.o. female, G1P0 at 28 2/7 weeks, presenting with contractions of increasing intensity and frequency over last several hours, with bloody show.  Planning waterbirth--attended class and signed consent.  Working with Cline Cools, doula.    Patient Active Problem List   Diagnosis Date Noted  . Active labor 08/04/2013  . Positive GBS test 08/04/2013  . Infertility, female 02/06/2012  . Anxiety 02/06/2012  . Multiple drug allergies 02/06/2012  . Meniere's disease 02/06/2012  AMA--had normal genetic screening Takes Dramamine Non-Drowsy formula for Meniere's dx.  History of present pregnancy: Patient entered care at 28 2/7 weeks.  Had conceived on Femara.   EDC of 08/09/13 was established by LMP and early [place].   Anatomy scan:  19 weeks, with normal findings and an posterior placenta.   Additional [place] evaluations:   37 weeks:  EFW 7+8, 85%ile, AFI 11.6, 40%, vtx  Significant prenatal events:  Positive urine culture at NOB with GBS--Rx'd with Keflex without difficulty.  [facility] testing WNL, female gender.  Declined AFP.  Used Diclegis in early pregnancy with benefit. TDap 05/10/13.  Rash on abdomen at 36 weeks, but this resolved spontaneously.  Attended WB class and signed consent, also desires to participate in WB study. Last evaluation:  07/28/13--no VE.  OB History   Grav Para Term Preterm Abortions TAB SAB Ect Mult Living   1              Past Medical History  Diagnosis Date  . Venereal disease 2005  . Arthritis   . High cholesterol   . Plantar fasciitis   . Allergy   . Anxiety   . Meniere disease    Past Surgical History  Procedure Laterality Date  . Tonsillectomy and adenoidectomy  43 YRS OLD  . Wisdom tooth extraction     Family History: family history includes Anxiety disorder in her mother; Heart disease in her maternal grandfather, maternal grandmother, paternal grandfather, and paternal grandmother; Hypertension in her father; Other in her mother; Ovarian  cancer in her cousin and paternal aunt; Stroke in her maternal grandmother and paternal grandmother.  Social History:  reports that she has never smoked. She has never used smokeless tobacco. She reports that she drinks about 0.5 ounces of alcohol per week. She reports that she does not use illicit drugs.  Husband, Louie Casa, is involved and supportive.  Patient is employed at [employer] as Scientist, water quality. She and her husband are college-educated.   Prenatal Transfer Tool  Maternal Diabetes: No Genetic Screening: Normal [facility] Maternal Ultrasounds/Referrals: Normal Fetal Ultrasounds or other Referrals:  None Maternal Substance Abuse:  No Significant Maternal Medications:  None Significant Maternal Lab Results: Lab values include: Group B Strep positive  ROS:  Contractions, +FM, + show  Allergies  Allergen Reactions  . Bactrim [Sulfamethoxazole-Tmp Ds] Other (See Comments)    CAUSES JOINTS TO LOCK   . Compazine [Prochlorperazine]   . Doxycycline   . Penicillins Swelling    Has taken Keflex without difficulty  . Hydrocodone Nausea And Vomiting  . Other Nausea And Vomiting    ALL NARCOTIC PAIN MEDS PER PT   . Percocet [Oxycodone-Acetaminophen] Nausea And Vomiting  . Ultram [Tramadol] Nausea And Vomiting     Dilation: 4.5 Effacement (%): 100 Station: -1 Exam by:: V Lathum CNM Blood pressure 120/54, pulse 101, temperature 98.4 F (36.9 C), temperature source Oral, resp. rate 18, last menstrual period 08/18/2012.  Chest clear Heart RRR without murmur Abd gravid,  NT, FH 39 Pelvic: as above Ext: WNL  FHR: Category 1 UCs:  q 3-4 min, moderate  Prenatal labs: ABO, Rh: O/Positive/-- (08/01 0000)O+ Antibody: Negative (08/01 0000)Neg Rubella:   Immune RPR: Nonreactive (08/01 0000) NR HBsAg: Negative (08/01 0000) Neg HIV: Non-reactive (08/01 0000) NR GBS: Negative (08/01 0000)Positive by urine screen 12/18/12 Sickle cell/Hgb electrophoresis:  NA Pap:  01/2012  WNL GC:  Negative 12/18/12 Chlamydia:  Negative 12/18/12 Genetic screenings: Normal [facility], declined AFP Glucola:  WNL Other:  Hgb 13.2 at NOB, 12.3 at 28 weeks Toxo titers negative 12/18/12   Assessment/Plan: IUP at 39 2/7 weeks  Active labor GBS positive--PCN allergic Planning waterbirth--Kenny Althia Forts, doula  Plan: Admit to [facility] per consult with Dr. Landry Mellow Routine [facility] orders GBS prophylaxis with Ancef--has taken Keflex in past without issue OK for waterbirth tub use--consent signed in office. Intermittent monitoring.  [place], [place], [place] 08/04/2013, 3:04 AM

## 2013-08-04 NOTE — Progress Notes (Signed)
  Subjective: In tub, coping well with UCs.  Husband and doula at tubside, very supportive.  Objective: BP 120/54  Pulse 101  Temp(Src) 98.4 F (36.9 C) (Oral)  Resp 18  Ht 5\' 5"  (1.651 m)  Wt 182 lb (82.555 kg)  BMI 30.29 kg/m2  LMP 08/18/2012      FHT:  Category 1 on intermittent monitoring, accels noted. UC:   regular, every 3 minutes SVE:  Deferred at present Completed 1st dose of Ancef--tolerated well.  Assessment / Plan: Active labor GBS positive Will CTO for further progress.  Verma Grothaus, [place] 08/04/2013, 4:22 AM

## 2013-08-05 LAB — CBC
HEMATOCRIT: 31.2 % — AB (ref 36.0–46.0)
Hemoglobin: 11.1 g/dL — ABNORMAL LOW (ref 12.0–15.0)
MCH: 31.6 pg (ref 26.0–34.0)
MCHC: 35.6 g/dL (ref 30.0–36.0)
MCV: 88.9 fL (ref 78.0–100.0)
Platelets: 198 10*3/uL (ref 150–400)
RBC: 3.51 MIL/uL — ABNORMAL LOW (ref 3.87–5.11)
RDW: 13.6 % (ref 11.5–15.5)
WBC: 12.9 10*3/uL — ABNORMAL HIGH (ref 4.0–10.5)

## 2013-08-05 NOTE — Progress Notes (Signed)
Post Partum Day 1:S/P SVB, 2nd degree laceration Received single ATB dose before delivery  Subjective: Patient up ad lib, denies syncope or dizziness. Feeding:  Breast Contraceptive plan:   Undecided  Objective: Blood pressure 89/57, pulse 71, temperature 98.2 F (36.8 C), temperature source Oral, resp. rate 18, height 5\' 5"  (1.651 m), weight 182 lb (82.555 kg), last menstrual period 08/18/2012, SpO2 96.00%, unknown if currently breastfeeding.  Physical Exam:  General: alert Lochia: appropriate Uterine Fundus: firm Incision: healing well DVT Evaluation: No evidence of DVT seen on physical exam. Negative Homan's sign.   Recent Labs  08/04/13 0300 08/05/13 0628  HGB 13.2 11.1*  HCT 36.2 31.2*    Assessment/Plan: S/P Vaginal delivery day 1 Continue current care Plan for discharge tomorrow, due to limited GBS treatment before delivery.   LOS: 1 day   Tamara Combs 08/05/2013, 8:11 AM

## 2013-08-06 MED ORDER — IBUPROFEN 600 MG PO TABS: 600.0000 mg | ORAL_TABLET | Freq: Four times a day (QID) | ORAL | Status: DC | PRN

## 2013-08-06 NOTE — Discharge Instructions (Signed)

## 2013-08-06 NOTE — Progress Notes (Signed)
History of anxiety noted in prenatal history.  Patient says she has not had any problems recently and declines social service consult.

## 2013-08-06 NOTE — Discharge Summary (Signed)
Vaginal Delivery Discharge Summary  Tamara Combs  DOB:    [birth year] MRN:    [id number] CSN:    [id number]  Date of admission:                  08/04/13  Date of discharge:                   08/06/13  Procedures this admission:  SVB, waterbirth, repair of 2nd degree laceration  Date of Delivery: 08/04/13  Newborn Data:  Live born female  Birth Weight: 8 lb 12.8 oz (3992 g) APGAR: 9, 9  Home with mother. Name: Tamara Combs Circumcision Plan: Inpatient  History of Present Illness:  Tamara Combs is a 43 y.o. female, G1P1001, who presents at [redacted]w[redacted]d weeks gestation. The patient has been followed at the [facility] and Gynecology division of [facility]. She was admitted onset of labor. Her pregnancy has been complicated by:  Patient Active Problem List   Diagnosis Date Noted  . Positive GBS test 08/04/2013  . Vaginal delivery 08/04/2013  . Infertility, female 02/06/2012  . Anxiety 02/06/2012  . Multiple drug allergies 02/06/2012  . Meniere's disease 02/06/2012     Hospital course:  The patient was admitted for active.   Her labor was not complicated. She proceeded to have a vaginal delivery via waterbirth tub of a healthy infant, attended by Donnel Saxon, CNM. Her delivery was not complicated. She had a 2nd degree perineal tear and bilateral periurethral tears--right side had single suture, left none.  Her postpartum course was not complicated. She was discharged to home on postpartum day 2 doing well.  Feeding:  breast  Contraception:  Will decide--hx infertility.  Discharge hemoglobin:  Hemoglobin  Date Value Ref Range Status  08/05/2013 11.1* 12.0 - 15.0 g/dL Final  08/27/2012 12.8  12.2 - 16.2 g/dL Final     HCT  Date Value Ref Range Status  08/05/2013 31.2* 36.0 - 46.0 % Final     HCT, POC  Date Value Ref Range Status  08/27/2012 40.1  37.7 - 47.9 % Final    Discharge Physical Exam:   General: alert Lochia:  appropriate Uterine Fundus: firm Incision: healing well DVT Evaluation: No evidence of DVT seen on physical exam. Negative Homan's sign.  Intrapartum Procedures: spontaneous vaginal delivery, waterbirth Postpartum Procedures: none Complications-Operative and Postpartum: 2nd degree perineal laceration, bilateral periurethral lacerations  Discharge Diagnoses: Term Pregnancy-delivered  Discharge Information:  Activity:           pelvic rest Diet:                routine Medications: Ibuprofen Condition:      stable Instructions:   Postpartum Care After Vaginal Delivery  After you deliver your newborn (postpartum period), the usual stay in the hospital is 24 72 hours. If there were problems with your labor or delivery, or if you have other medical problems, you might be in the hospital longer.  While you are in the hospital, you will receive help and instructions on how to care for yourself and your newborn during the postpartum period.  While you are in the hospital:  Be sure to tell your nurses if you have pain or discomfort, as well as where you feel the pain and what makes the pain worse.  If you had an incision made near your vagina (episiotomy) or if you had some tearing during delivery, the nurses may put ice packs on your episiotomy or  tear. The ice packs may help to reduce the pain and swelling.  If you are breastfeeding, you may feel uncomfortable contractions of your uterus for a couple of weeks. This is normal. The contractions help your uterus get back to normal size.  It is normal to have some bleeding after delivery.  For the first 1 3 days after delivery, the flow is red and the amount may be similar to a period.  It is common for the flow to start and stop.  In the first few days, you may pass some small clots. Let your nurses know if you begin to pass large clots or your flow increases.  Do not  flush blood clots down the toilet before having the nurse look at  them.  During the next 3 10 days after delivery, your flow should become more watery and pink or brown-tinged in color.  Ten to fourteen days after delivery, your flow should be a small amount of yellowish-white discharge.  The amount of your flow will decrease over the first few weeks after delivery. Your flow may stop in 6 8 weeks. Most women have had their flow stop by 12 weeks after delivery.  You should change your sanitary pads frequently.  Wash your hands thoroughly with soap and water for at least 20 seconds after changing pads, using the toilet, or before holding or feeding your newborn.  You should feel like you need to empty your bladder within the first 6 8 hours after delivery.  In case you become weak, lightheaded, or faint, call your nurse before you get out of bed for the first time and before you take a shower for the first time.  Within the first few days after delivery, your breasts may begin to feel tender and full. This is called engorgement. Breast tenderness usually goes away within 48 72 hours after engorgement occurs. You may also notice milk leaking from your breasts. If you are not breastfeeding, do not stimulate your breasts. Breast stimulation can make your breasts produce more milk.  Spending as much time as possible with your newborn is very important. During this time, you and your newborn can feel close and get to know each other. Having your newborn stay in your room (rooming in) will help to strengthen the bond with your newborn. It will give you time to get to know your newborn and become comfortable caring for your newborn.  Your hormones change after delivery. Sometimes the hormone changes can temporarily cause you to feel sad or tearful. These feelings should not last more than a few days. If these feelings last longer than that, you should talk to your caregiver.  If desired, talk to your caregiver about methods of family planning or  contraception.  Talk to your caregiver about immunizations. Your caregiver may want you to have the following immunizations before leaving the hospital:  Tetanus, diphtheria, and pertussis (Tdap) or tetanus and diphtheria (Td) immunization. It is very important that you and your family (including grandparents) or others caring for your newborn are up-to-date with the Tdap or Td immunizations. The Tdap or Td immunization can help protect your newborn from getting ill.  Rubella immunization.  Varicella (chickenpox) immunization.  Influenza immunization. You should receive this annual immunization if you did not receive the immunization during your pregnancy. Document Released: 03/03/2007 Document Revised: 01/29/2012 Document Reviewed: 01/01/2012 [facility] Patient Information 2014 [place].   Postpartum Depression and Baby Blues  The postpartum period begins right after the  birth of a baby. During this time, there is often a great amount of joy and excitement. It is also a time of considerable changes in the life of the parent(s). Regardless of how many times a mother gives birth, each child brings new challenges and dynamics to the family. It is not unusual to have feelings of excitement accompanied by confusing shifts in moods, emotions, and thoughts. All mothers are at risk of developing postpartum depression or the "baby blues." These mood changes can occur right after giving birth, or they may occur many months after giving birth. The baby blues or postpartum depression can be mild or severe. Additionally, postpartum depression can resolve rather quickly, or it can be a long-term condition. CAUSES Elevated hormones and their rapid decline are thought to be a main cause of postpartum depression and the baby blues. There are a number of hormones that radically change during and after pregnancy. Estrogen and progesterone usually decrease immediately after delivering your baby. The level of  thyroid hormone and various cortisol steroids also rapidly drop. Other factors that play a major role in these changes include major life events and genetics.  RISK FACTORS If you have any of the following risks for the baby blues or postpartum depression, know what symptoms to watch out for during the postpartum period. Risk factors that may increase the likelihood of getting the baby blues or postpartum depression include:  Havinga personal or family history of depression.  Having depression while being pregnant.  Having premenstrual or oral contraceptive-associated mood issues.  Having exceptional life stress.  Having marital conflict.  Lacking a social support network.  Having a baby with special needs.  Having health problems such as diabetes. SYMPTOMS Baby blues symptoms include:  Brief fluctuations in mood, such as going from extreme happiness to sadness.  Decreased concentration.  Difficulty sleeping.  Crying spells, tearfulness.  Irritability.  Anxiety. Postpartum depression symptoms typically begin within the first month after giving birth. These symptoms include:  Difficulty sleeping or excessive sleepiness.  Marked weight loss.  Agitation.  Feelings of worthlessness.  Lack of interest in activity or food. Postpartum psychosis is a very concerning condition and can be dangerous. Fortunately, it is rare. Displaying any of the following symptoms is cause for immediate medical attention. Postpartum psychosis symptoms include:  Hallucinations and delusions.  Bizarre or disorganized behavior.  Confusion or disorientation. DIAGNOSIS  A diagnosis is made by an evaluation of your symptoms. There are no medical or lab tests that lead to a diagnosis, but there are various questionnaires that a caregiver may use to identify those with the baby blues, postpartum depression, or psychosis. Often times, a screening tool called the [place] Postnatal Depression Scale  is used to diagnose depression in the postpartum period.  TREATMENT The baby blues usually goes away on its own in 1 to 2 weeks. Social support is often all that is needed. You should be encouraged to get adequate sleep and rest. Occasionally, you may be given medicines to help you sleep.  Postpartum depression requires treatment as it can last several months or longer if it is not treated. Treatment may include individual or group therapy, medicine, or both to address any social, physiological, and psychological factors that may play a role in the depression. Regular exercise, a healthy diet, rest, and social support may also be strongly recommended.  Postpartum psychosis is more serious and needs treatment right away. Hospitalization is often needed. HOME CARE INSTRUCTIONS  Get as much rest as  you can. Nap when the baby sleeps.  Exercise regularly. Some women find yoga and walking to be beneficial.  Eat a balanced and nourishing diet.  Do little things that you enjoy. Have a cup of tea, take a bubble bath, read your favorite magazine, or listen to your favorite music.  Avoid alcohol.  Ask for help with household chores, cooking, grocery shopping, or running errands as needed. Do not try to do everything.  Talk to people close to you about how you are feeling. Get support from your partner, family members, friends, or other new moms.  Try to stay positive in how you think. Think about the things you are grateful for.  Do not spend a lot of time alone.  Only take medicine as directed by your caregiver.  Keep all your postpartum appointments.  Let your caregiver know if you have any concerns. SEEK MEDICAL CARE IF: You are having a reaction or problems with your medicine. SEEK IMMEDIATE MEDICAL CARE IF:  You have suicidal feelings.  You feel you may harm the baby or someone else. Document Released: 02/08/2004 Document Revised: 07/29/2011 Document Reviewed: 03/12/2011 [facility]  Patient Information 2014 [place], [place].   Discharge to: home  Follow-up Information   Follow up with [facility]. Schedule an appointment as soon as possible for a visit in 6 weeks. (Call for any questions or concerns.)    Specialty:  Obstetrics and Gynecology   Contact information:   [address] [place] [address] [phone]       Donnel Saxon 08/06/2013

## 2013-08-09 ENCOUNTER — Ambulatory Visit (HOSPITAL_COMMUNITY)
Admission: RE | Admit: 2013-08-09 | Discharge: 2013-08-09 | Disposition: A | Discharge status: Home / Self Care | Payer: BC Managed Care – PPO | Source: Ambulatory Visit | Attending: Obstetrics and Gynecology | Admitting: Obstetrics and Gynecology

## 2013-08-09 NOTE — Lactation Note (Addendum)
Adult Lactation Consultation Outpatient Visit Note  Patient Name: Tamara Combs Date of Birth: January 29, 1971 Gestational Age at Delivery: [redacted]w[redacted]d Type of Delivery: vag  Breastfeeding History: Frequency of Breastfeeding: Q 3 during the daytime- hourly at night Length of Feeding: 10-30 min Voids: QS Stools: QS  Supplementing / Method: Pumping:  Type of Pump:   Frequency:  Volume:    Comments:    Consultation Evaluation:; Mom here today because of sore nipples and weight loss at DC. Weight at [facility] office on Sat 8 lbs 0 oz  Initial Feeding Assessment: Pre-feed Weight: 8- 5.6  3786g Post-feed Weight: 8- 7.6  3844 Amount Transferred: 58 cc's Comments: Mom with healing positional stripes- scabs noted on both nipples. Breasts are full. Baby last fed about 2 hours ago. Mom reports that he feeds a lot through the night for 10 min then goes off to sleep but then hungry again in an hour.Assisted with latch in cross cradle hold. Mom has been using the football hold every feeding. Reviewed basic teaching- holding the breast throughout feedings, wide open mouth and keeping the baby close to the breast. To sit back and relax shoulders and back. Mom reports after the first few sucks, pain eases off and feels comfortable. Lots of swallows noted and mom reports breast feels softer.  Additional Feeding Assessment: Pre-feed Weight: 8- 7.6  3844g Post-feed Weight: 8- 8.1  3860 Amount Transferred: 16 cc's Comments: Again latched in cross cradle hold. Lots of swallows noted and breast feels softer. Off to sleep after 10 min. New set of comfort gels given with instructions   Total Breast milk Transferred this Visit: 74 cc's Total Supplement Given: 0  Additional Interventions:   Follow-Up  With Ped today- F/U for jaundice Reviewed B[facility] as resource for support  To call here with questions/concerns or if needs another OP appointment    Linus Mako D 08/09/2013, 11:16 AM

## 2013-10-05 ENCOUNTER — Ambulatory Visit (INDEPENDENT_AMBULATORY_CARE_PROVIDER_SITE_OTHER): Payer: Self-pay | Admitting: General Surgery

## 2013-10-19 ENCOUNTER — Encounter (INDEPENDENT_AMBULATORY_CARE_PROVIDER_SITE_OTHER): Payer: Self-pay | Admitting: General Surgery

## 2013-10-19 ENCOUNTER — Ambulatory Visit (INDEPENDENT_AMBULATORY_CARE_PROVIDER_SITE_OTHER): Payer: BC Managed Care – PPO | Admitting: General Surgery

## 2013-10-19 VITALS — BP 122/72 | HR 68 | Temp 98.0°F | Resp 18 | Ht 64.0 in | Wt 154.0 lb

## 2013-10-19 DIAGNOSIS — D1739 Benign lipomatous neoplasm of skin and subcutaneous tissue of other sites: Secondary | ICD-10-CM

## 2013-10-19 DIAGNOSIS — D171 Benign lipomatous neoplasm of skin and subcutaneous tissue of trunk: Secondary | ICD-10-CM | POA: Insufficient documentation

## 2013-10-19 DIAGNOSIS — K644 Residual hemorrhoidal skin tags: Secondary | ICD-10-CM

## 2013-10-19 MED ORDER — HYDROCORTISONE 2.5 % RE CREA: 1.0000 "application " | TOPICAL_CREAM | Freq: Two times a day (BID) | RECTAL | Status: DC

## 2013-10-19 NOTE — Progress Notes (Signed)
Subjective:     Patient ID: Tamara Combs, female   DOB: [birth year], 43 y.o.   MRN: [id number]  HPI The patient comes in with 2 complaints. One is a left lower abdominal wall mass which she's had for at least 10 years but during pregnancy became significantly more symptomatic with tenderness and some bruising.  The cycle one is a perianal skin tag which we will examine shortly.  Review of Systems Pain in the perianal skin tag with a running.    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 It is a little bit firm more so than what she was feeling lipoma but mobile and she's had for 10 years.  On the perianal area at approximately the 2:00 position with 12:00 being directly posterior and shows a 1 cm external hemorrhoid which could be easily excised however a water try to treat him medically with some Anusol cream.    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 #2 external hemorrhoid at the 2:00 position without thrombosis.     Plan:     Treatment the perianal hemorrhoids with Anusol a.c. Cream for 2 weeks to reassess patient.  She is considering whether not to have a left low quadrant as removed. Is increasingly more symptomatic and she is going to think about whether or not she wants to have surgery.

## 2013-10-28 IMAGING — US US ABDOMEN LIMITED
1 series · 14 of 17 positions shown · non-contrast
Comparison: No priors.

CLINICAL DATA: Left lower quadrant skin lesion that has and
palpable for the past couple of years.  Tenderness to palpation.

LIMITED ABDOMINAL ULTRASOUND

[Series 1: us abdomen limited · 0.17mm/px · 14 of 17 slices shown]
[im 1/17]
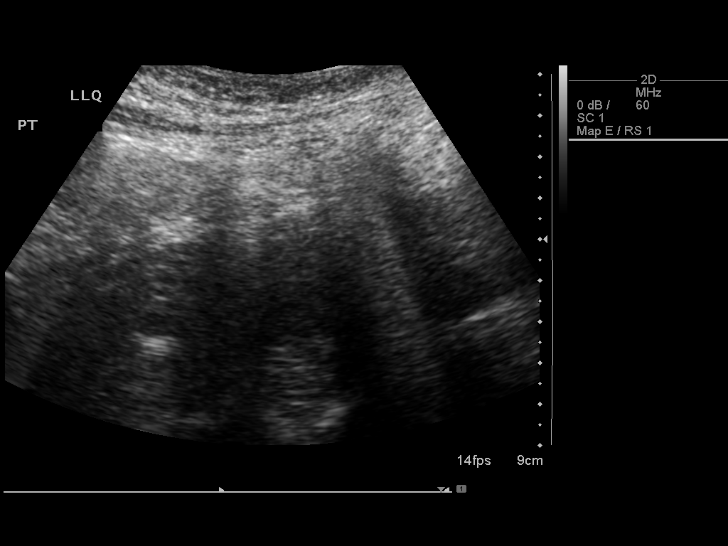
[im 2/17]
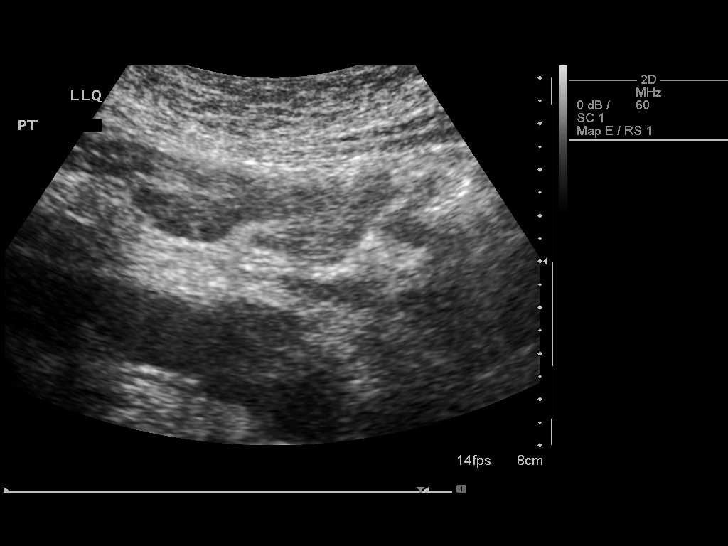
[im 4/17]
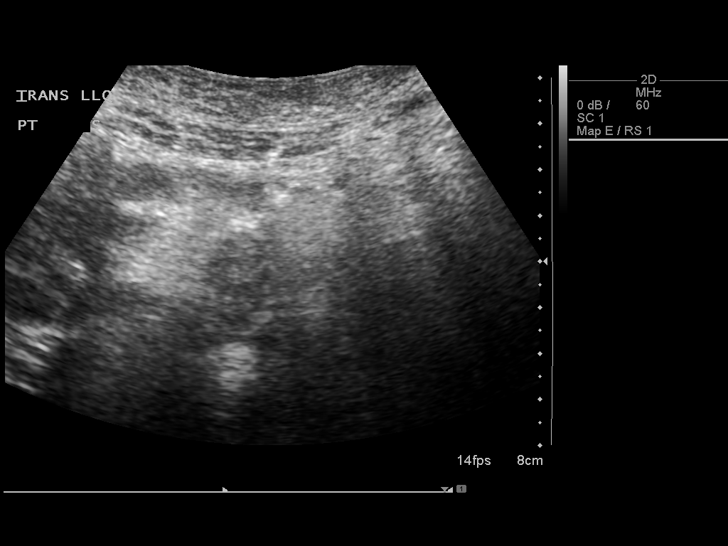
[im 5/17]
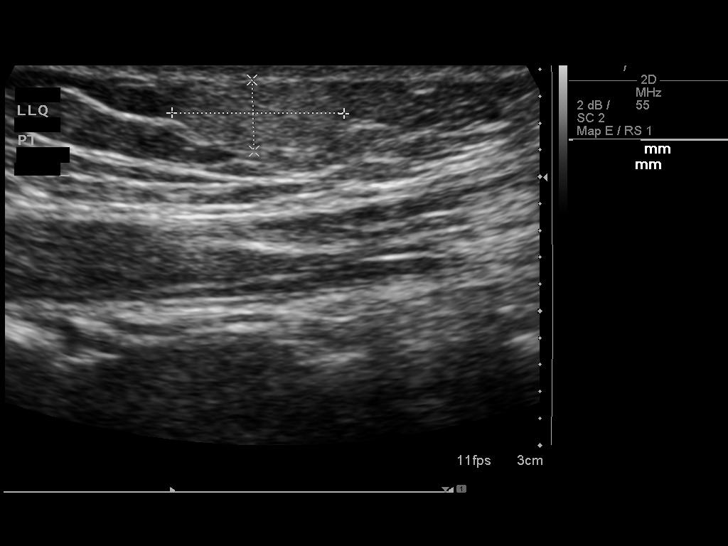
[im 6/17]
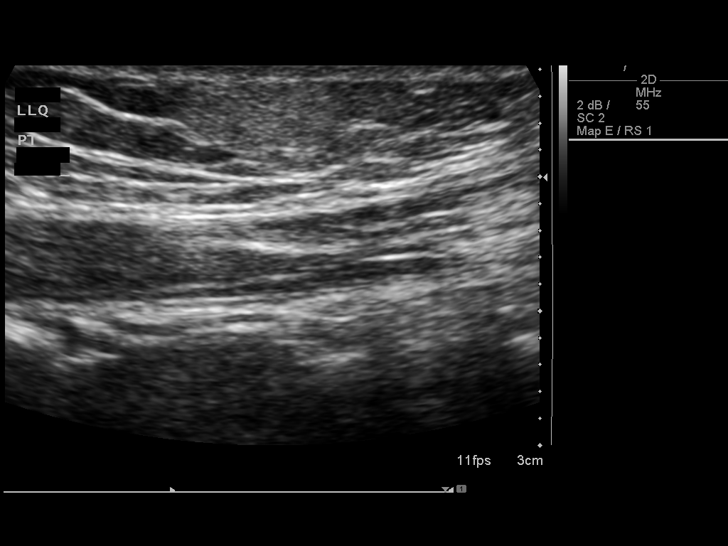
[im 7/17]
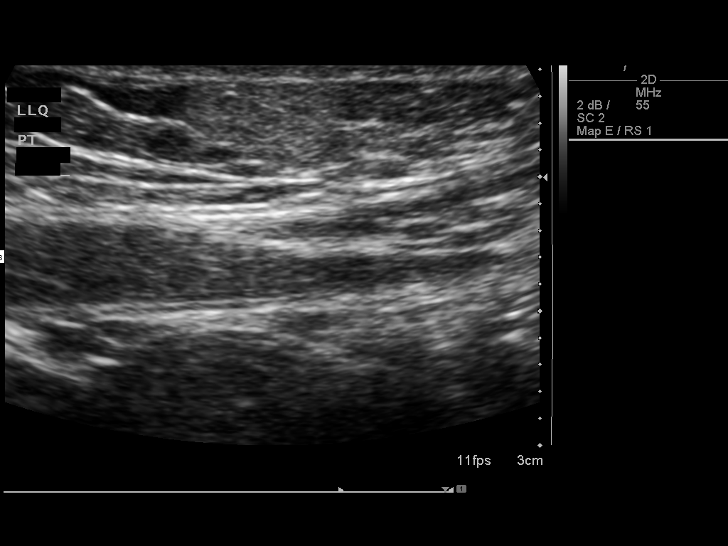
[im 8/17]
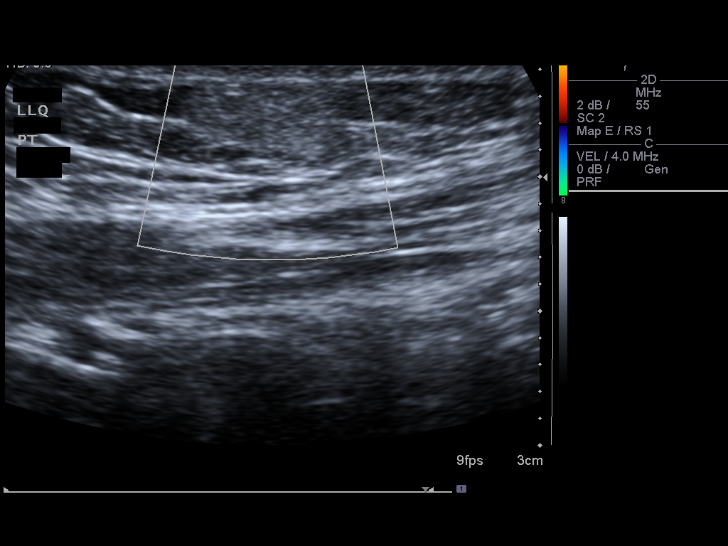
[im 10/17]
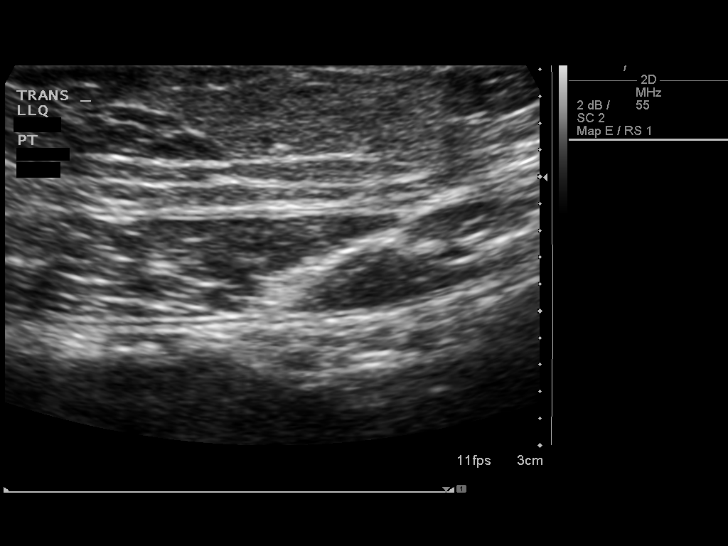
[im 11/17]
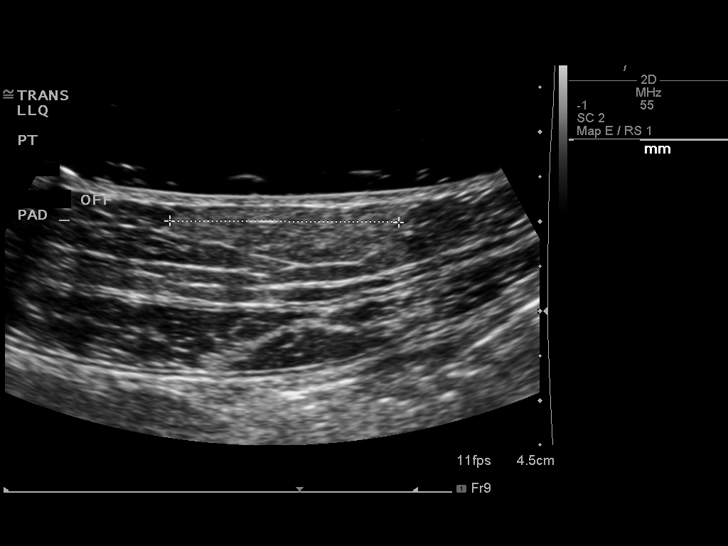
[im 12/17]
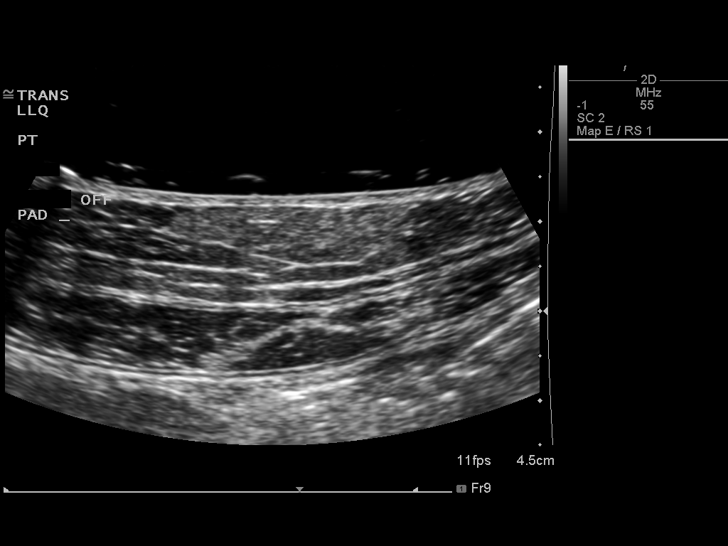
[im 13/17]
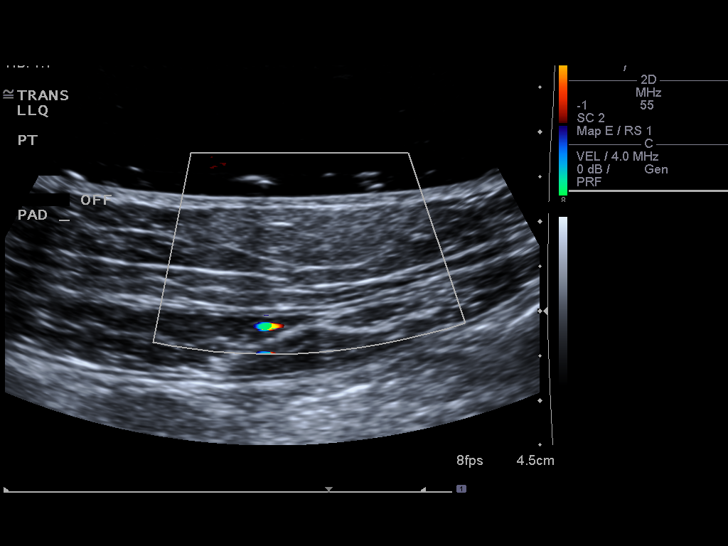
[im 14/17]
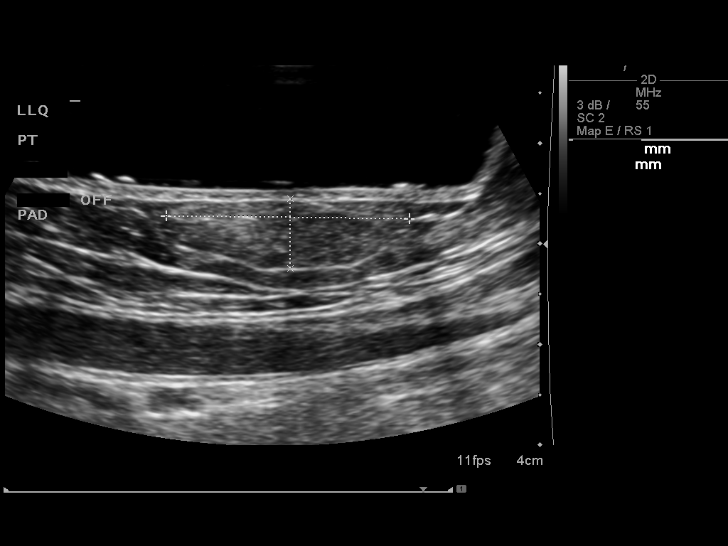
[im 16/17]
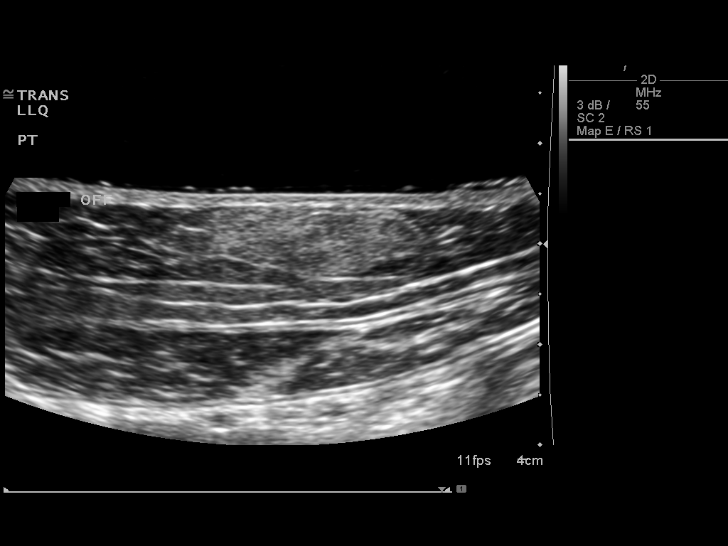
[im 17/17]
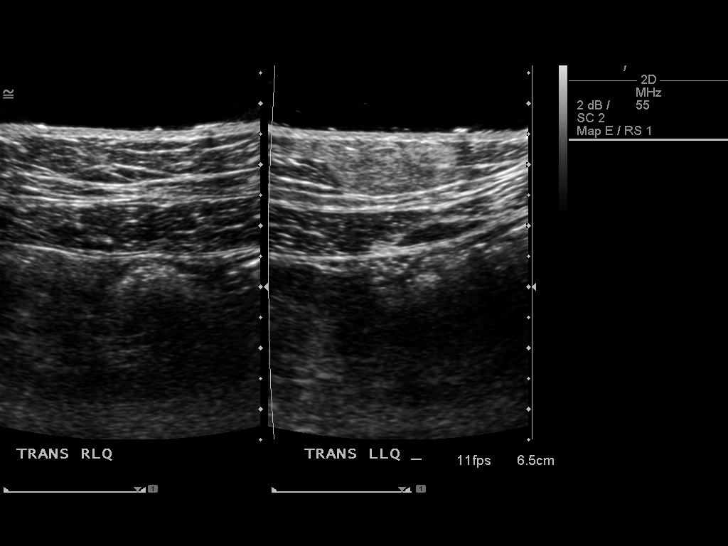

[14 of 17 positions shown; findings below may reference images not displayed]

FINDINGS: In the superficial subcutaneous fat overlying the left
lower quadrant of the abdomen and there is a well-circumscribed
x 2.4 x 0.7 cm echogenic lesion which appears to be encapsulated
and slightly higher echogenicity than adjacent surrounding
subcutaneous fat.  No significant internal blood flow is
appreciated on color Doppler imaging. No definite focal soft tissue
area or internal cystic regions.
IMPRESSION: 1.  Well-circumscribed 2.6 x 2.4 x 0.7 cm echogenic lesion in the
subcutaneous soft tissues of the left lower quadrant of the abdomen
has imaging characteristics that favor a small lipoma.

## 2013-11-09 ENCOUNTER — Encounter (INDEPENDENT_AMBULATORY_CARE_PROVIDER_SITE_OTHER): Payer: BC Managed Care – PPO | Admitting: General Surgery

## 2014-03-21 ENCOUNTER — Encounter (INDEPENDENT_AMBULATORY_CARE_PROVIDER_SITE_OTHER): Payer: Self-pay | Admitting: General Surgery

## 2015-07-09 ENCOUNTER — Emergency Department (HOSPITAL_COMMUNITY)
Admission: EM | Admit: 2015-07-09 | Discharge: 2015-07-09 | Disposition: A | Discharge status: Home / Self Care | Payer: BLUE CROSS/BLUE SHIELD | Attending: Emergency Medicine | Admitting: Emergency Medicine

## 2015-07-09 ENCOUNTER — Emergency Department (HOSPITAL_COMMUNITY): Payer: BLUE CROSS/BLUE SHIELD

## 2015-07-09 ENCOUNTER — Encounter (HOSPITAL_COMMUNITY): Payer: Self-pay | Admitting: Emergency Medicine

## 2015-07-09 DIAGNOSIS — Y990 Civilian activity done for income or pay: Secondary | ICD-10-CM | POA: Insufficient documentation

## 2015-07-09 DIAGNOSIS — Z880 Allergy status to penicillin: Secondary | ICD-10-CM | POA: Insufficient documentation

## 2015-07-09 DIAGNOSIS — Z8739 Personal history of other diseases of the musculoskeletal system and connective tissue: Secondary | ICD-10-CM | POA: Insufficient documentation

## 2015-07-09 DIAGNOSIS — Y92482 Bike path as the place of occurrence of the external cause: Secondary | ICD-10-CM | POA: Insufficient documentation

## 2015-07-09 DIAGNOSIS — Z8639 Personal history of other endocrine, nutritional and metabolic disease: Secondary | ICD-10-CM | POA: Diagnosis not present

## 2015-07-09 DIAGNOSIS — Z8619 Personal history of other infectious and parasitic diseases: Secondary | ICD-10-CM | POA: Insufficient documentation

## 2015-07-09 DIAGNOSIS — Z79899 Other long term (current) drug therapy: Secondary | ICD-10-CM | POA: Diagnosis not present

## 2015-07-09 DIAGNOSIS — M1990 Unspecified osteoarthritis, unspecified site: Secondary | ICD-10-CM | POA: Diagnosis not present

## 2015-07-09 DIAGNOSIS — S4991XA Unspecified injury of right shoulder and upper arm, initial encounter: Secondary | ICD-10-CM | POA: Insufficient documentation

## 2015-07-09 DIAGNOSIS — Y9355 Activity, bike riding: Secondary | ICD-10-CM | POA: Insufficient documentation

## 2015-07-09 DIAGNOSIS — Z8669 Personal history of other diseases of the nervous system and sense organs: Secondary | ICD-10-CM | POA: Insufficient documentation

## 2015-07-09 DIAGNOSIS — M25511 Pain in right shoulder: Secondary | ICD-10-CM

## 2015-07-09 DIAGNOSIS — W19XXXA Unspecified fall, initial encounter: Secondary | ICD-10-CM

## 2015-07-09 DIAGNOSIS — Z7952 Long term (current) use of systemic steroids: Secondary | ICD-10-CM | POA: Insufficient documentation

## 2015-07-09 MED ORDER — IBUPROFEN 800 MG PO TABS: 800.0000 mg | ORAL_TABLET | Freq: Three times a day (TID) | ORAL | Status: DC

## 2015-07-09 MED ORDER — CYCLOBENZAPRINE HCL 10 MG PO TABS: 10.0000 mg | ORAL_TABLET | Freq: Two times a day (BID) | ORAL | Status: DC | PRN

## 2015-07-09 MED ORDER — IBUPROFEN 800 MG PO TABS
800.0000 mg | ORAL_TABLET | Freq: Once | ORAL | Status: AC
  Administered 2015-07-09: 800 mg via ORAL
  Filled 2015-07-09: qty 1

## 2015-07-09 NOTE — ED Notes (Signed)
Patient was alert, oriented and stable upon discharge. RN went over AVS and patient had no further questions.  
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
## 2015-07-09 NOTE — Discharge Instructions (Signed)
Take your medications as prescribed. I also recommend continuing to apply ice to affected area for 15-20 minutes 3-4 times daily. Refrain from doing any heavy lifting, excessive exercising or repetitive movements that exacerbate her symptoms for the next few days. I recommend following up with her orthopedist within the next 1-2 weeks if your symptoms have not improved or have worsened. Return to the emergency department if symptoms worsen or new onset of fever, redness, swelling, numbness, tingling, weakness.

## 2016-05-13 ENCOUNTER — Emergency Department (HOSPITAL_COMMUNITY)
Admission: EM | Admit: 2016-05-13 | Discharge: 2016-05-13 | Disposition: A | Discharge status: Home / Self Care | Payer: No Typology Code available for payment source | Attending: Physician Assistant | Admitting: Physician Assistant

## 2016-05-13 ENCOUNTER — Encounter (HOSPITAL_COMMUNITY): Payer: Self-pay | Admitting: Emergency Medicine

## 2016-05-13 DIAGNOSIS — Y999 Unspecified external cause status: Secondary | ICD-10-CM | POA: Diagnosis not present

## 2016-05-13 DIAGNOSIS — S161XXA Strain of muscle, fascia and tendon at neck level, initial encounter: Secondary | ICD-10-CM | POA: Diagnosis not present

## 2016-05-13 DIAGNOSIS — Y92410 Unspecified street and highway as the place of occurrence of the external cause: Secondary | ICD-10-CM | POA: Diagnosis not present

## 2016-05-13 DIAGNOSIS — S60512A Abrasion of left hand, initial encounter: Secondary | ICD-10-CM | POA: Insufficient documentation

## 2016-05-13 DIAGNOSIS — Z79899 Other long term (current) drug therapy: Secondary | ICD-10-CM | POA: Diagnosis not present

## 2016-05-13 DIAGNOSIS — Y939 Activity, unspecified: Secondary | ICD-10-CM | POA: Diagnosis not present

## 2016-05-13 DIAGNOSIS — S199XXA Unspecified injury of neck, initial encounter: Secondary | ICD-10-CM | POA: Diagnosis present

## 2016-05-13 MED ORDER — CYCLOBENZAPRINE HCL 10 MG PO TABS: 10.0000 mg | ORAL_TABLET | Freq: Two times a day (BID) | ORAL | 0 refills | Status: DC | PRN
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
## 2016-05-13 NOTE — Discharge Instructions (Signed)
Do not drive while taking the muscle relaxant as it will make you sleepy. °

## 2016-09-24 NOTE — Progress Notes (Signed)
Cardiology Office Note   Date:  09/26/2016   ID:  Tamara Combs, DOB 1970-11-30, MRN [id number]  PCP:  Kathyrn Lass, MD  Cardiologist:   Minus Breeding, MD  Referring:  Kathyrn Lass, MD  Chief Complaint  Patient presents with  . Bradycardia  . Dizziness      History of Present Illness: Tamara Combs is a 46 y.o. female who was referred by Kathyrn Lass, MD for evaluation of sinus bradycardia.  The patient is noted to have a heart rate in her 57s. 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  She was getting dizzy about 2 months ago. This would happen sporadically. It was particularly noticeable when she changes positions from lying to standing or standing up. She would see things go gray. She will get lightheaded. She does have been years and was on triamterene/hydrochlorothiazide for this. This was actually stopped.  Since the she has done well and her symptoms have resolved.  She does have issues with her Mnire's. The patient denies any new symptoms such as chest discomfort, neck or arm discomfort. There has been no new shortness of breath, PND or orthopnea. There have been no reported palpitations, presyncope or syncope.   Past Medical History:  Diagnosis Date  . Allergy   . Anxiety   . Arthritis   . High cholesterol   . Meniere disease   . Plantar fasciitis   . Venereal disease 2005    Past Surgical History:  Procedure Laterality Date  . TONSILLECTOMY AND ADENOIDECTOMY  46 YRS OLD  . WISDOM TOOTH EXTRACTION       Current Outpatient Prescriptions  Medication Sig Dispense Refill  . meclizine (ANTIVERT) 25 MG tablet Take 25 mg by mouth daily as needed for dizziness.    . Norgestimate-Ethinyl Estradiol Triphasic (TRINESSA LO) 0.18/0.215/0.25 MG-25 MCG tab Take 1 tablet by mouth daily.    Marland Kitchen VITAMIN D, CHOLECALCIFEROL, PO Take 1 tablet by mouth daily.     No current facility-administered medications for this visit.     Allergies:   Bactrim [sulfamethoxazole-trimethoprim];  Compazine [prochlorperazine]; Doxycycline; Penicillins; Hydrocodone; Other; Percocet [oxycodone-acetaminophen]; and Ultram [tramadol]    Social History:  The patient  reports that she has never smoked. She has never used smokeless tobacco. She reports that she drinks about 0.5 oz of alcohol per week . She reports that she does not use drugs.   Family History:  The patient's family history includes Anxiety disorder in her mother; Heart disease in her maternal grandfather, maternal grandmother, and paternal grandfather; Heart disease (age of onset: 35) in her paternal grandmother; Hypertension in her father; Other in her mother; Ovarian cancer in her cousin and paternal aunt; Stroke in her maternal grandmother and paternal grandmother.    ROS:  Please see the history of present illness.   Otherwise, review of systems are positive for none.   All other systems are reviewed and negative.    PHYSICAL EXAM: VS:  BP 102/68 (BP Location: Right Arm, Patient Position: Sitting, Cuff Size: Normal)   Pulse 62   Ht 5\' 5"  (1.651 m)   Wt 144 lb (65.3 kg)   BMI 23.96 kg/m  , BMI Body mass index is 23.96 kg/m. GENERAL:  Well appearing NECK:  No jugular venous distention, waveform within normal limits, carotid upstroke brisk and symmetric, no bruits, no thyromegaly LYMPHATICS:  No cervical, inguinal adenopathy LUNGS:  Clear to auscultation bilaterally BACK:  No CVA tenderness CHEST:  Unremarkable HEART:  PMI not displaced  or sustained,S1 and S2 within normal limits, no S3, no S4, no clicks, no rubs, no murmurs ABD:  Flat, positive bowel sounds normal in frequency in pitch, no bruits, no rebound, no guarding, no midline pulsatile mass, no hepatomegaly, no splenomegaly EXT:  2 plus pulses throughout, no edema, no cyanosis no clubbing    EKG:  EKG is not ordered today. The ekg ordered 08/26/16 demonstrates sinus bradycardia, rate 52, axis within normal limits, intervals within normal limits, RSR prime V1 and  V2, no acute ST T-wave changes.   Recent Labs: No results found for requested labs within last 8760 hours.    Lipid Panel No results found for: CHOL, TRIG, HDL, CHOLHDL, VLDL, LDLCALC, LDLDIRECT    Wt Readings from Last 3 Encounters:  09/25/16 144 lb (65.3 kg)  05/13/16 140 lb (63.5 kg)  10/19/13 154 lb (69.9 kg)      Other studies Reviewed: Additional studies/ records that were reviewed today include: Office records. Review of the above records demonstrates:  Please see elsewhere in the note.     ASSESSMENT AND PLAN:  BRADYCARDIA:  This is physiologic and represents likely good vagal tone. No change in therapy is indicated.  DIZZINESS:  Is has improved and was very likely related to the medication which is now discontinued. No further workup is indicated.   Current medicines are reviewed at length with the patient today.  The patient does not have concerns regarding medicines.  The following changes have been made:  no change  Labs/ tests ordered today include: None No orders of the defined types were placed in this encounter.    Disposition:   FU with me as needed.      Signed, Minus Breeding, MD  09/26/2016 12:41 PM    [facility]

## 2016-09-25 ENCOUNTER — Encounter: Payer: Self-pay | Admitting: Cardiology

## 2016-09-25 ENCOUNTER — Ambulatory Visit (INDEPENDENT_AMBULATORY_CARE_PROVIDER_SITE_OTHER): Payer: 59 | Admitting: Cardiology

## 2016-09-25 VITALS — BP 102/68 | HR 62 | Ht 65.0 in | Wt 144.0 lb

## 2016-09-25 DIAGNOSIS — R001 Bradycardia, unspecified: Secondary | ICD-10-CM

## 2016-09-25 DIAGNOSIS — R42 Dizziness and giddiness: Secondary | ICD-10-CM | POA: Diagnosis not present

## 2016-09-25 NOTE — Patient Instructions (Signed)
Medication Instructions:  Continue current medications  Labwork: None Ordered  Testing/Procedures: None Ordered  Follow-Up: Your physician recommends that you schedule a follow-up appointment in: As Needed   Any Other Special Instructions Will Be Listed Below (If Applicable).   If you need a refill on your cardiac medications before your next appointment, please call your pharmacy.   

## 2016-09-26 ENCOUNTER — Encounter: Payer: Self-pay | Admitting: Cardiology

## 2016-09-26 DIAGNOSIS — R42 Dizziness and giddiness: Secondary | ICD-10-CM | POA: Insufficient documentation

## 2016-09-26 DIAGNOSIS — R001 Bradycardia, unspecified: Secondary | ICD-10-CM | POA: Insufficient documentation

## 2017-08-17 DIAGNOSIS — J069 Acute upper respiratory infection, unspecified: Secondary | ICD-10-CM | POA: Diagnosis not present

## 2017-08-17 DIAGNOSIS — Z20818 Contact with and (suspected) exposure to other bacterial communicable diseases: Secondary | ICD-10-CM | POA: Diagnosis not present

## 2017-10-14 DIAGNOSIS — D225 Melanocytic nevi of trunk: Secondary | ICD-10-CM | POA: Diagnosis not present

## 2017-10-14 DIAGNOSIS — L309 Dermatitis, unspecified: Secondary | ICD-10-CM | POA: Diagnosis not present

## 2017-10-14 DIAGNOSIS — D1801 Hemangioma of skin and subcutaneous tissue: Secondary | ICD-10-CM | POA: Diagnosis not present

## 2017-10-16 DIAGNOSIS — R21 Rash and other nonspecific skin eruption: Secondary | ICD-10-CM | POA: Diagnosis not present

## 2017-10-16 DIAGNOSIS — L259 Unspecified contact dermatitis, unspecified cause: Secondary | ICD-10-CM | POA: Diagnosis not present

## 2017-10-16 DIAGNOSIS — L299 Pruritus, unspecified: Secondary | ICD-10-CM | POA: Diagnosis not present

## 2017-10-16 DIAGNOSIS — D485 Neoplasm of uncertain behavior of skin: Secondary | ICD-10-CM | POA: Diagnosis not present

## 2017-10-22 DIAGNOSIS — L309 Dermatitis, unspecified: Secondary | ICD-10-CM | POA: Diagnosis not present

## 2017-10-23 DIAGNOSIS — B379 Candidiasis, unspecified: Secondary | ICD-10-CM | POA: Diagnosis not present

## 2017-11-25 DIAGNOSIS — L309 Dermatitis, unspecified: Secondary | ICD-10-CM | POA: Diagnosis not present

## 2017-12-01 DIAGNOSIS — L239 Allergic contact dermatitis, unspecified cause: Secondary | ICD-10-CM | POA: Diagnosis not present

## 2017-12-01 DIAGNOSIS — L308 Other specified dermatitis: Secondary | ICD-10-CM | POA: Diagnosis not present

## 2017-12-11 DIAGNOSIS — H938X2 Other specified disorders of left ear: Secondary | ICD-10-CM | POA: Diagnosis not present

## 2017-12-18 DIAGNOSIS — J302 Other seasonal allergic rhinitis: Secondary | ICD-10-CM | POA: Diagnosis not present

## 2017-12-18 DIAGNOSIS — H6500 Acute serous otitis media, unspecified ear: Secondary | ICD-10-CM | POA: Diagnosis not present

## 2017-12-24 DIAGNOSIS — H8103 Meniere's disease, bilateral: Secondary | ICD-10-CM | POA: Diagnosis not present

## 2017-12-24 DIAGNOSIS — H906 Mixed conductive and sensorineural hearing loss, bilateral: Secondary | ICD-10-CM | POA: Diagnosis not present

## 2017-12-24 DIAGNOSIS — H9012 Conductive hearing loss, unilateral, left ear, with unrestricted hearing on the contralateral side: Secondary | ICD-10-CM | POA: Diagnosis not present

## 2017-12-24 DIAGNOSIS — H9312 Tinnitus, left ear: Secondary | ICD-10-CM | POA: Diagnosis not present

## 2018-03-04 DIAGNOSIS — R42 Dizziness and giddiness: Secondary | ICD-10-CM | POA: Diagnosis not present

## 2018-03-04 DIAGNOSIS — H9041 Sensorineural hearing loss, unilateral, right ear, with unrestricted hearing on the contralateral side: Secondary | ICD-10-CM | POA: Diagnosis not present

## 2018-03-04 DIAGNOSIS — H8101 Meniere's disease, right ear: Secondary | ICD-10-CM | POA: Diagnosis not present

## 2018-03-23 DIAGNOSIS — Z1231 Encounter for screening mammogram for malignant neoplasm of breast: Secondary | ICD-10-CM | POA: Diagnosis not present

## 2018-03-23 DIAGNOSIS — Z124 Encounter for screening for malignant neoplasm of cervix: Secondary | ICD-10-CM | POA: Diagnosis not present

## 2018-03-23 DIAGNOSIS — Z3040 Encounter for surveillance of contraceptives, unspecified: Secondary | ICD-10-CM | POA: Diagnosis not present

## 2018-03-23 DIAGNOSIS — Z01419 Encounter for gynecological examination (general) (routine) without abnormal findings: Secondary | ICD-10-CM | POA: Diagnosis not present

## 2018-03-23 DIAGNOSIS — L900 Lichen sclerosus et atrophicus: Secondary | ICD-10-CM | POA: Diagnosis not present

## 2018-04-21 DIAGNOSIS — H8102 Meniere's disease, left ear: Secondary | ICD-10-CM | POA: Diagnosis not present

## 2018-04-21 DIAGNOSIS — R42 Dizziness and giddiness: Secondary | ICD-10-CM | POA: Diagnosis not present

## 2018-04-21 DIAGNOSIS — H9042 Sensorineural hearing loss, unilateral, left ear, with unrestricted hearing on the contralateral side: Secondary | ICD-10-CM | POA: Diagnosis not present

## 2018-05-21 DIAGNOSIS — M79671 Pain in right foot: Secondary | ICD-10-CM | POA: Diagnosis not present

## 2018-07-15 DIAGNOSIS — D485 Neoplasm of uncertain behavior of skin: Secondary | ICD-10-CM | POA: Diagnosis not present

## 2018-07-15 DIAGNOSIS — D224 Melanocytic nevi of scalp and neck: Secondary | ICD-10-CM | POA: Diagnosis not present

## 2018-07-15 DIAGNOSIS — D225 Melanocytic nevi of trunk: Secondary | ICD-10-CM | POA: Diagnosis not present

## 2018-07-21 DIAGNOSIS — H8102 Meniere's disease, left ear: Secondary | ICD-10-CM | POA: Diagnosis not present

## 2018-07-21 DIAGNOSIS — H9042 Sensorineural hearing loss, unilateral, left ear, with unrestricted hearing on the contralateral side: Secondary | ICD-10-CM | POA: Diagnosis not present

## 2018-10-26 DIAGNOSIS — L821 Other seborrheic keratosis: Secondary | ICD-10-CM | POA: Diagnosis not present

## 2018-10-26 DIAGNOSIS — L814 Other melanin hyperpigmentation: Secondary | ICD-10-CM | POA: Diagnosis not present

## 2018-10-26 DIAGNOSIS — L718 Other rosacea: Secondary | ICD-10-CM | POA: Diagnosis not present

## 2018-10-26 DIAGNOSIS — D1801 Hemangioma of skin and subcutaneous tissue: Secondary | ICD-10-CM | POA: Diagnosis not present

## 2019-02-05 DIAGNOSIS — H9042 Sensorineural hearing loss, unilateral, left ear, with unrestricted hearing on the contralateral side: Secondary | ICD-10-CM | POA: Diagnosis not present

## 2019-02-05 DIAGNOSIS — H8102 Meniere's disease, left ear: Secondary | ICD-10-CM | POA: Diagnosis not present

## 2019-03-16 ENCOUNTER — Encounter (INDEPENDENT_AMBULATORY_CARE_PROVIDER_SITE_OTHER): Payer: Self-pay

## 2019-03-31 DIAGNOSIS — Z6823 Body mass index (BMI) 23.0-23.9, adult: Secondary | ICD-10-CM | POA: Diagnosis not present

## 2019-03-31 DIAGNOSIS — Z3040 Encounter for surveillance of contraceptives, unspecified: Secondary | ICD-10-CM | POA: Diagnosis not present

## 2019-03-31 DIAGNOSIS — Z1231 Encounter for screening mammogram for malignant neoplasm of breast: Secondary | ICD-10-CM | POA: Diagnosis not present

## 2019-03-31 DIAGNOSIS — L900 Lichen sclerosus et atrophicus: Secondary | ICD-10-CM | POA: Diagnosis not present

## 2019-03-31 DIAGNOSIS — Z01419 Encounter for gynecological examination (general) (routine) without abnormal findings: Secondary | ICD-10-CM | POA: Diagnosis not present

## 2019-04-19 DIAGNOSIS — Z20828 Contact with and (suspected) exposure to other viral communicable diseases: Secondary | ICD-10-CM | POA: Diagnosis not present

## 2019-05-06 DIAGNOSIS — R350 Frequency of micturition: Secondary | ICD-10-CM | POA: Diagnosis not present

## 2019-05-06 DIAGNOSIS — R3912 Poor urinary stream: Secondary | ICD-10-CM | POA: Diagnosis not present

## 2019-05-06 DIAGNOSIS — R351 Nocturia: Secondary | ICD-10-CM | POA: Diagnosis not present

## 2019-09-03 ENCOUNTER — Other Ambulatory Visit (INDEPENDENT_AMBULATORY_CARE_PROVIDER_SITE_OTHER): Payer: Self-pay | Admitting: Otolaryngology

## 2019-09-04 LAB — BASIC METABOLIC PANEL
BUN/Creatinine Ratio: 18 (ref 9–23)
BUN: 16 mg/dL (ref 6–24)
CO2: 26 mmol/L (ref 20–29)
Calcium: 9.4 mg/dL (ref 8.7–10.2)
Chloride: 100 mmol/L (ref 96–106)
Creatinine, Ser: 0.88 mg/dL (ref 0.57–1.00)
GFR calc Af Amer: 91 mL/min/{1.73_m2} (ref >59)
GFR calc non Af Amer: 79 mL/min/{1.73_m2} (ref >59)
Glucose: 92 mg/dL (ref 65–99)
Potassium: 4.7 mmol/L (ref 3.5–5.2)
Sodium: 138 mmol/L (ref 134–144)

## 2022-06-21 ENCOUNTER — Other Ambulatory Visit: Payer: Self-pay | Admitting: Diagnostic Radiology

## 2022-06-21 ENCOUNTER — Encounter: Payer: Self-pay | Admitting: Diagnostic Radiology

## 2022-06-21 DIAGNOSIS — N63 Unspecified lump in unspecified breast: Secondary | ICD-10-CM

## 2022-06-28 ENCOUNTER — Ambulatory Visit
Admission: RE | Admit: 2022-06-28 | Discharge: 2022-06-28 | Disposition: A | Discharge status: Home / Self Care | Payer: No Typology Code available for payment source | Source: Ambulatory Visit | Attending: Diagnostic Radiology | Admitting: Diagnostic Radiology

## 2022-06-28 ENCOUNTER — Ambulatory Visit
Admission: RE | Admit: 2022-06-28 | Discharge: 2022-06-28 | Disposition: A | Discharge status: Home / Self Care | Payer: 59 | Source: Ambulatory Visit | Attending: Diagnostic Radiology | Admitting: Diagnostic Radiology

## 2022-06-28 ENCOUNTER — Other Ambulatory Visit: Payer: Self-pay | Admitting: Obstetrics and Gynecology

## 2022-06-28 DIAGNOSIS — N63 Unspecified lump in unspecified breast: Secondary | ICD-10-CM

## 2023-04-04 ENCOUNTER — Ambulatory Visit (INDEPENDENT_AMBULATORY_CARE_PROVIDER_SITE_OTHER): Payer: No Typology Code available for payment source

## 2023-07-24 ENCOUNTER — Other Ambulatory Visit: Payer: Self-pay | Admitting: Obstetrics and Gynecology

## 2023-07-24 DIAGNOSIS — Z1231 Encounter for screening mammogram for malignant neoplasm of breast: Secondary | ICD-10-CM

## 2023-08-13 ENCOUNTER — Ambulatory Visit

## 2023-08-14 ENCOUNTER — Ambulatory Visit

## 2023-08-14 ENCOUNTER — Ambulatory Visit
Admission: RE | Admit: 2023-08-14 | Discharge: 2023-08-14 | Disposition: A | Discharge status: Home / Self Care | Source: Ambulatory Visit | Attending: Obstetrics and Gynecology | Admitting: Obstetrics and Gynecology

## 2023-08-14 DIAGNOSIS — Z1231 Encounter for screening mammogram for malignant neoplasm of breast: Secondary | ICD-10-CM

## 2024-04-02 ENCOUNTER — Other Ambulatory Visit: Payer: Self-pay | Admitting: Obstetrics and Gynecology

## 2024-04-02 DIAGNOSIS — M7989 Other specified soft tissue disorders: Secondary | ICD-10-CM

## 2024-04-27 ENCOUNTER — Other Ambulatory Visit: Payer: Self-pay | Admitting: Obstetrics and Gynecology

## 2024-04-27 ENCOUNTER — Ambulatory Visit
Admission: RE | Admit: 2024-04-27 | Discharge: 2024-04-27 | Disposition: A | Source: Ambulatory Visit | Attending: Obstetrics and Gynecology | Admitting: Obstetrics and Gynecology

## 2024-04-27 DIAGNOSIS — M7989 Other specified soft tissue disorders: Secondary | ICD-10-CM
